# Patient Record
Sex: Female | Born: 1950 | Race: White | Hispanic: No | Marital: Married | Smoking: Never smoker
Health system: Southern US, Community
[De-identification: ages and names within clinical notes are randomized; demographics above are authoritative.]

## PROBLEM LIST (undated history)

## (undated) DIAGNOSIS — J309 Allergic rhinitis, unspecified: Secondary | ICD-10-CM

## (undated) DIAGNOSIS — F419 Anxiety disorder, unspecified: Secondary | ICD-10-CM

## (undated) DIAGNOSIS — I471 Supraventricular tachycardia, unspecified: Secondary | ICD-10-CM

## (undated) DIAGNOSIS — H8110 Benign paroxysmal vertigo, unspecified ear: Secondary | ICD-10-CM

## (undated) DIAGNOSIS — E785 Hyperlipidemia, unspecified: Secondary | ICD-10-CM

## (undated) DIAGNOSIS — F439 Reaction to severe stress, unspecified: Secondary | ICD-10-CM

## (undated) DIAGNOSIS — R079 Chest pain, unspecified: Secondary | ICD-10-CM

## (undated) HISTORY — DX: Chest pain, unspecified: R07.9

## (undated) HISTORY — DX: Allergic rhinitis, unspecified: J30.9

## (undated) HISTORY — DX: Benign paroxysmal vertigo, unspecified ear: H81.10

## (undated) HISTORY — DX: Supraventricular tachycardia, unspecified: I47.10

## (undated) HISTORY — DX: Reaction to severe stress, unspecified: F43.9

## (undated) HISTORY — DX: Supraventricular tachycardia: I47.1

## (undated) HISTORY — DX: Anxiety disorder, unspecified: F41.9

## (undated) HISTORY — DX: Hyperlipidemia, unspecified: E78.5

---
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
## 1990-07-24 HISTORY — PX: SEPTOPLASTY: SUR1290

## 1990-07-24 HISTORY — PX: BUNIONECTOMY: SHX129

## 1998-02-25 ENCOUNTER — Other Ambulatory Visit: Admission: RE | Admit: 1998-02-25 | Discharge: 1998-02-25 | Payer: Self-pay | Admitting: Obstetrics and Gynecology

## 1998-06-08 ENCOUNTER — Other Ambulatory Visit: Admission: RE | Admit: 1998-06-08 | Discharge: 1998-06-08 | Payer: Self-pay | Admitting: Obstetrics and Gynecology

## 1998-07-29 ENCOUNTER — Other Ambulatory Visit: Admission: RE | Admit: 1998-07-29 | Discharge: 1998-07-29 | Payer: Self-pay | Admitting: Obstetrics and Gynecology

## 1998-10-21 ENCOUNTER — Other Ambulatory Visit: Admission: RE | Admit: 1998-10-21 | Discharge: 1998-10-21 | Payer: Self-pay | Admitting: Obstetrics and Gynecology

## 1998-11-30 ENCOUNTER — Other Ambulatory Visit: Admission: RE | Admit: 1998-11-30 | Discharge: 1998-11-30 | Payer: Self-pay | Admitting: Obstetrics and Gynecology

## 1999-03-02 ENCOUNTER — Other Ambulatory Visit: Admission: RE | Admit: 1999-03-02 | Discharge: 1999-03-02 | Payer: Self-pay | Admitting: Obstetrics and Gynecology

## 1999-12-29 ENCOUNTER — Other Ambulatory Visit: Admission: RE | Admit: 1999-12-29 | Discharge: 1999-12-29 | Payer: Self-pay | Admitting: Obstetrics and Gynecology

## 2000-01-02 ENCOUNTER — Encounter (INDEPENDENT_AMBULATORY_CARE_PROVIDER_SITE_OTHER): Payer: Self-pay

## 2000-01-02 ENCOUNTER — Other Ambulatory Visit: Admission: RE | Admit: 2000-01-02 | Discharge: 2000-01-02 | Payer: Self-pay | Admitting: Obstetrics and Gynecology

## 2001-02-07 ENCOUNTER — Other Ambulatory Visit: Admission: RE | Admit: 2001-02-07 | Discharge: 2001-02-07 | Payer: Self-pay | Admitting: Obstetrics and Gynecology

## 2002-02-10 ENCOUNTER — Other Ambulatory Visit: Admission: RE | Admit: 2002-02-10 | Discharge: 2002-02-10 | Payer: Self-pay | Admitting: Obstetrics and Gynecology

## 2003-03-02 ENCOUNTER — Other Ambulatory Visit: Admission: RE | Admit: 2003-03-02 | Discharge: 2003-03-02 | Payer: Self-pay | Admitting: Obstetrics and Gynecology

## 2005-01-06 ENCOUNTER — Other Ambulatory Visit: Admission: RE | Admit: 2005-01-06 | Discharge: 2005-01-06 | Payer: Self-pay | Admitting: Family Medicine

## 2009-02-09 ENCOUNTER — Other Ambulatory Visit: Admission: RE | Admit: 2009-02-09 | Discharge: 2009-02-09 | Payer: Self-pay | Admitting: Family Medicine

## 2009-04-20 ENCOUNTER — Encounter: Admission: RE | Admit: 2009-04-20 | Discharge: 2009-04-20 | Payer: Self-pay | Admitting: Family Medicine

## 2009-04-27 ENCOUNTER — Encounter: Admission: RE | Admit: 2009-04-27 | Discharge: 2009-04-27 | Payer: Self-pay | Admitting: Family Medicine

## 2010-05-30 ENCOUNTER — Encounter: Admission: RE | Admit: 2010-05-30 | Discharge: 2010-05-30 | Payer: Self-pay | Admitting: Family Medicine

## 2010-06-28 ENCOUNTER — Other Ambulatory Visit
Admission: RE | Admit: 2010-06-28 | Discharge: 2010-06-28 | Payer: Self-pay | Source: Home / Self Care | Admitting: Family Medicine

## 2010-08-14 ENCOUNTER — Encounter: Payer: Self-pay | Admitting: Family Medicine

## 2011-04-27 ENCOUNTER — Other Ambulatory Visit: Payer: Self-pay | Admitting: Family Medicine

## 2011-04-27 DIAGNOSIS — Z1231 Encounter for screening mammogram for malignant neoplasm of breast: Secondary | ICD-10-CM

## 2011-06-01 ENCOUNTER — Ambulatory Visit
Admission: RE | Admit: 2011-06-01 | Discharge: 2011-06-01 | Disposition: A | Discharge status: Home / Self Care | Payer: BC Managed Care – PPO | Source: Ambulatory Visit | Attending: Family Medicine | Admitting: Family Medicine

## 2011-06-01 DIAGNOSIS — Z1231 Encounter for screening mammogram for malignant neoplasm of breast: Secondary | ICD-10-CM

## 2011-09-14 ENCOUNTER — Ambulatory Visit (INDEPENDENT_AMBULATORY_CARE_PROVIDER_SITE_OTHER): Payer: BC Managed Care – PPO | Admitting: Sports Medicine

## 2011-09-14 ENCOUNTER — Encounter: Payer: Self-pay | Admitting: Sports Medicine

## 2011-09-14 VITALS — BP 130/85 | HR 77 | Ht 65.0 in | Wt 145.0 lb

## 2011-09-14 DIAGNOSIS — M7660 Achilles tendinitis, unspecified leg: Secondary | ICD-10-CM

## 2011-09-14 DIAGNOSIS — M7661 Achilles tendinitis, right leg: Secondary | ICD-10-CM

## 2011-09-14 DIAGNOSIS — M7662 Achilles tendinitis, left leg: Secondary | ICD-10-CM

## 2011-09-14 MED ORDER — NITROGLYCERIN 0.2 MG/HR TD PT24: MEDICATED_PATCH | TRANSDERMAL | Status: DC

## 2011-09-14 NOTE — Patient Instructions (Addendum)
Use 1/4 patch of NTG to RT Achilles - place over nodule and leave for 24 hours Then replace the next day and keep doing this   use heel lifts to take pressure off Achilles  Exercise program to rehabilitate the Achilles  Do twice daily with up to mild to moderate pain OK - stop or reduce if moderate to severe pain  After 1 week if no headaches try using NTG patches on both Achilles tendons  Recheck with [place] in 4 to 6 weeks  Thank you for seeing [place] today!

## 2011-09-14 NOTE — Progress Notes (Signed)
  Subjective:    Patient ID: Sheryl Edwards, female    DOB: [birth year], 61 y.o.   MRN: [id number]  HPI  Sheryl Edwards is a pleasant 61 yo female patient with chronic history of Achilles tendinopathy bilateral. She was seen about 6 years at [facility] and referred  to physical therapy for iontophoresis treatment which did not help much. She also has been using orthotics which has not helped her at all. She describes her pain he is a dull ache in both Achilles tendons, worse with weightbearing activities, 3/10 in intensity, she can not perform any physical activity due to the heel pain. No numbness or tingling. Denies an injury to her ankles. He has been a chronic and debilitating condition for her.  There is no problem list on file for this patient.  No current outpatient prescriptions on file prior to visit.   Allergies  Allergen Reactions  . Codeine Nausea Only     Review of Systems  Constitutional: Negative for fever, chills, diaphoresis and fatigue.  Musculoskeletal: Negative for back pain, joint swelling, arthralgias and gait problem.  Neurological: Negative for dizziness, tremors, weakness and numbness.       Objective:   Physical Exam  Constitutional: She is oriented to person, place, and time. She appears well-developed and well-nourished.       BP 130/85  Pulse 77  Ht 5\' 5"  (1.651 m)  Wt 145 lb (65.772 kg)  BMI 24.13 kg/m2   Pulmonary/Chest: Effort normal.  Musculoskeletal:        Right ankle with intact skin, no swelling , no inflammation. FROM. Achilles tendon is intact. There is a nodule in the watershed area of the left tendon measuring 2 cm in diameter. Tender to palpation. Thompson test is negative. She is able to do resisted plantarflexion. Neurovascularly intact   Left ankle with intact skin, no swelling , no inflammation. FROM. Achilles tendon is intact. There is a nodule in the watershed area of the left tendon measuring 2.5 cm in diameter. Tender to palpation.  Thompson test is negative. She is able to do resisted plantarflexion. Neurovascularly intact .             Neurological: She is alert and oriented to person, place, and time.  Skin: Skin is warm. No rash noted. No erythema.  Psychiatric: She has a normal mood and affect. Her behavior is normal. Thought content normal.      MSK U/S : Right achilles with a nodule measuring 0.85cm, linear hypoechoic image along the Achilles tendon and intrasubstance in the Achilles tendon. No Achilles tendon rupture. No increase in Doppler flow activity. No calcification in the attachment of the Achilles tendon in the calcaneus.  Left achilles with a nodule measuring 1.05cm, linear hypoechoic image along the Achilles tendon  and intrasubstance in the Achilles tendon less that the right side,. No Achilles tendon rupture. No increase in Doppler flow activity. No calcification in the attachment of the Achilles tendon in the calcaneus.        Assessment & Plan:   1. Achilles tendinitis on left   2. Achilles rupture, right    Nitro patch protocol Heel lift Achilles excentric stretches/strenghtening exercises program F/U in 6 weeks

## 2011-10-04 ENCOUNTER — Other Ambulatory Visit: Payer: Self-pay | Admitting: *Deleted

## 2011-10-04 MED ORDER — NITROGLYCERIN 0.2 MG/HR TD PT24: MEDICATED_PATCH | TRANSDERMAL | Status: DC

## 2011-10-12 ENCOUNTER — Encounter: Payer: Self-pay | Admitting: Sports Medicine

## 2011-10-12 ENCOUNTER — Ambulatory Visit (INDEPENDENT_AMBULATORY_CARE_PROVIDER_SITE_OTHER): Payer: BC Managed Care – PPO | Admitting: Sports Medicine

## 2011-10-12 VITALS — BP 142/90 | HR 65

## 2011-10-12 DIAGNOSIS — M7660 Achilles tendinitis, unspecified leg: Secondary | ICD-10-CM | POA: Insufficient documentation

## 2011-10-12 NOTE — Assessment & Plan Note (Signed)
She has excellent improvement with only 6 weeks of treatment I think the nodule on the right will take 6-12 months to resolve  Keep up nitroglycerin patches Keep up exercise program  Recheck in 6 weeks and repeat ultrasound  Heel lifts looked okay today but return if she needs replacements

## 2011-10-12 NOTE — Progress Notes (Signed)
  Subjective:    Patient ID: Sheryl Edwards, female    DOB: [birth year], 61 y.o.   MRN: [id number]  HPI  Pt presents to clinic for f/u of bilat achilles tendinitis which she feels is improving. Notes AT nodule on rt has gotten smaller.  Compliant with home exercises. Using 1/2 NTG patch on bilat ATs daily. Soreness has improved.  States that her leg "seizes up" behind her knees when she walks too much or if she does too many heel raises.     Review of Systems     Objective:   Physical Exam No acute distress Good dorsi and plantar flexion bilat  Right Achilles tendon has a nodule that is thickened from 2-6 cm above the calcaneus This is now nontender This feels somewhat smoother than on last examination Remainder of tendon feels normal and is slightly thick  Left Achilles tendon does not show any nodules There is mild thickness noted now and not as much as before  MSK ultrasound Nodule on right Achilles tendon still is 0.85 cm in greatest thickness The tendon has much less hypoechoic change The split area in the right tendon has now closed and there is some calcification  Left Achilles tendon shows normal echoic change The tendon is now 0.7 cm in greatest thickness whereas it was 1 point for a last visit Remainder of this tendon looks normal      Assessment & Plan:

## 2011-10-12 NOTE — Patient Instructions (Addendum)
Please continue using 1/2 nitroglycerin on each achilles tendon  Continue heel raises - start doing some on 1 leg at a time  Please follow up in 6 weeks   Thank you for seeing [place] today!

## 2011-11-23 ENCOUNTER — Encounter: Payer: Self-pay | Admitting: Sports Medicine

## 2011-11-23 ENCOUNTER — Ambulatory Visit (INDEPENDENT_AMBULATORY_CARE_PROVIDER_SITE_OTHER): Payer: BC Managed Care – PPO | Admitting: Sports Medicine

## 2011-11-23 VITALS — BP 131/85 | HR 61

## 2011-11-23 DIAGNOSIS — M7660 Achilles tendinitis, unspecified leg: Secondary | ICD-10-CM

## 2011-11-23 NOTE — Patient Instructions (Signed)
Continue 1/2 nitroglycerin patch on each achilles tendon  Continue home exercises  Please follow up in 6 weeks  Thank you for seeing [place] today!

## 2011-11-23 NOTE — Assessment & Plan Note (Signed)
Bilateral tendinopathy present for several years  Now starting to feel better and allowing her to walk more  Cont NTG protocol Modify exercises by what she can tolerate without calf tightness persisting  Reck 6 wks  Expect we will be treating her long term - 12 mons or so to try to remodel these

## 2011-11-23 NOTE — Progress Notes (Signed)
  Subjective:    Patient ID: Sheryl Edwards, female    DOB: [birth year], 61 y.o.   MRN: [id number]  HPI  Pt presents to clinic for f/u of bilat achilles tendinitis with nodules which she reports is 25% improved. She still has burning and pain sometimes.  Has calf cramping with a lot of walking or when doing the home exercises.   Wearing heel lifts as much as possible. Using 1/2 patch on bilat achilles.   Review of Systems:  Still gets mild headaches at times w patch     Objective:   Physical Exam NAD  Rt nodule swollen and tender 4-6 cm above heel Lt AT feels slightly thick, but close to normal shape Lt foot- hallux valgus shift, lat 5th MTP subluxation RT has less shift of great toe  MSK [place] Nodule on RT is still 1.03 cms thick AP No neovessels Trans view shows good tendon structure and no real gaps in tissue  Left AT is less nodular and more fusiform AT is still ~ 0.9 cm thick AP No neovessels         Assessment & Plan:

## 2012-01-04 ENCOUNTER — Encounter: Payer: Self-pay | Admitting: Sports Medicine

## 2012-01-04 ENCOUNTER — Ambulatory Visit (INDEPENDENT_AMBULATORY_CARE_PROVIDER_SITE_OTHER): Payer: BC Managed Care – PPO | Admitting: Sports Medicine

## 2012-01-04 VITALS — BP 127/84 | HR 68

## 2012-01-04 DIAGNOSIS — M7660 Achilles tendinitis, unspecified leg: Secondary | ICD-10-CM

## 2012-01-04 NOTE — Assessment & Plan Note (Signed)
This is severe with chronic thickening and is bilateral Large nodule on the right will probably take some time to heal  I would continue her nitroglycerin and her exercise program Trial of a compression sleeve today  Heel lifts  I think it is reasonable to try A.RT therapy and see if she gets some benefit. She will see Dr. Vear Clock and if this is improving we'll continue with that treatment. We will Dr. Theron Arista scan and recheck her in 2 months.

## 2012-01-04 NOTE — Progress Notes (Signed)
  Subjective:    Patient ID: Sheryl Edwards, female    DOB: [birth year], 61 y.o.   MRN: [id number]  HPI  Pt presents to clinic for f/u of bilat achilles tendinitis which she feels has improved slightly. Continues using 1/2 NTG patch daily. Compliant with home exercises- heel raises. Has seen [facility] for ART 3 times- was painful at 1st treatment, but improved the 2nd and 3rd.  Her strength has improved as she can now do the heel raise exercises on one leg She can do as many as 20 [place] before her calf feels tight  She does not have limitations at work but does have soreness and some pain in the evenings when she returns home or after she's been sitting for a while    Review of Systems     Objective:   Physical Exam No acute distress  Able to do heel raises straight leg, and bent knee with proper form  Right Achilles tendon has a thick nodule about 5-6 cm above the calcaneus  there is no redness or palpable swelling over other parts of the tendon  Left Achilles tendon feels thickened but seem smooth to palpation with no nodules No redness or swelling  Tenderness to palpation is less bilaterally  Muscular skeletal ultrasound The right Achilles tendon continues to show a thickened nodule that is now 1.15 cm in its widest area which is about 5-6 cm above the heel insertion. There is also some hypoechoic change over the nodule that appears to be possible fluid. Transverse scan of the nodule reveals that there is less fiber disruption Doppler activity is decreased but there are still a few abnormal neo-vessels  Left Achilles tendon shows normal appearance of the fibers No evidence of tearing The widest part of the tendon appears to be about 1.0 cm No abnormal Doppler activity       Assessment & Plan:

## 2012-01-04 NOTE — Patient Instructions (Addendum)
Continue using nitroglycerin patches on both heels  Continue heel raises   Use compression sleeve on ankle during the day as much as comfortable, and 2 hours at night if you do not wear during the day  Continue active release therapy   Please follow up in 2 months  Thank you for seeing [place] today!

## 2012-05-08 ENCOUNTER — Other Ambulatory Visit: Payer: Self-pay | Admitting: Dermatology

## 2012-08-30 ENCOUNTER — Other Ambulatory Visit: Payer: Self-pay | Admitting: Dermatology

## 2012-09-02 ENCOUNTER — Other Ambulatory Visit: Payer: Self-pay | Admitting: Obstetrics & Gynecology

## 2014-02-18 ENCOUNTER — Ambulatory Visit (INDEPENDENT_AMBULATORY_CARE_PROVIDER_SITE_OTHER): Payer: BC Managed Care – PPO

## 2014-02-18 VITALS — BP 123/85 | HR 75 | Resp 15 | Ht 65.0 in | Wt 153.0 lb

## 2014-02-18 DIAGNOSIS — M79609 Pain in unspecified limb: Secondary | ICD-10-CM

## 2014-02-18 DIAGNOSIS — M79605 Pain in left leg: Secondary | ICD-10-CM

## 2014-02-18 DIAGNOSIS — M722 Plantar fascial fibromatosis: Secondary | ICD-10-CM

## 2014-02-18 MED ORDER — TRIAMCINOLONE ACETONIDE 10 MG/ML IJ SUSP: 10.0000 mg | Freq: Once | INTRAMUSCULAR | Status: AC

## 2014-02-18 MED ORDER — MELOXICAM 15 MG PO TABS: 15.0000 mg | ORAL_TABLET | Freq: Every day | ORAL | Status: DC

## 2014-02-18 NOTE — Patient Instructions (Signed)

## 2014-02-18 NOTE — Progress Notes (Signed)
   Subjective:    Patient ID: Sheryl Edwards, female    DOB: 11/23/1950, 63 y.o.   MRN: [id number]  HPI Comments: N plantar fasciitis L left heel and inferior arch D 6 weeks ago O after a long walk in poor fitting shoes C pulling pain A weightbearing after sitting, and resting T stretching and Pilates, PT for right foot     Review of Systems  All other systems reviewed and are negative.      Objective:   Physical Exam  Lower extremity objective findings intact pedal pulses noted bilateral DP and PT Refill time 3 seconds all digits epicritic and proprioceptive sensations intact and symmetric is a 63-year-old female well-developed well-nourished oriented x3 presents at this time with recalcitrant recurrent heel pain. Her orthotics are worn need replacing or refurbishing at this time she has stopped using them left heel has re\re exacerbated there is a painful swollen lump on palpation at the medial calcaneal tubercle insertion site x-rays reveal most no spurs mild fascia thickening noted      Assessment & Plan:  Assessment plantar fasciitis/heel spur syndrome. At this time an injection tender with Kenalog 20 mg Xylocaine plain infiltrated to the left heel fascial strapping is applied patient request also patient will drop-off orthotics for refurbishing and recovering in the interim fascial strapping we'll be worn for 5 days also recommended ice to the area and [facility] or meloxicam is furnished for the patient. Evaluated at the next month for followup and orthotic use was orthotics have been recovered  Alvan Dameichard Sikora DPM

## 2014-02-25 ENCOUNTER — Ambulatory Visit: Payer: Self-pay

## 2014-03-18 ENCOUNTER — Ambulatory Visit (INDEPENDENT_AMBULATORY_CARE_PROVIDER_SITE_OTHER): Payer: BC Managed Care – PPO

## 2014-03-18 VITALS — BP 130/82 | HR 70 | Resp 12

## 2014-03-18 DIAGNOSIS — M79605 Pain in left leg: Secondary | ICD-10-CM

## 2014-03-18 DIAGNOSIS — M79609 Pain in unspecified limb: Secondary | ICD-10-CM

## 2014-03-18 DIAGNOSIS — M722 Plantar fascial fibromatosis: Secondary | ICD-10-CM

## 2014-03-18 NOTE — Progress Notes (Signed)
   Subjective:    Patient ID: Sheryl Edwards, female    DOB: July 05, 1951, 63 y.o.   MRN: [id number]  HPI ''LT FOOT HEEL IS DOING MUCH BETTER.''   Review of Systems no new findings or systemic changes noted    Objective:   Physical Exam March the objective findings unchanged patient had significant improvement plantar fascial pain with fascial strapping does have orthotics are worn need recovering however her insurance does not cover her visit to 5000 unremarkable it just started over this time I will recover her old orthotics and patient recalled was orthotics are ready for fitting and dispensing in the interim maintain ice and anti-inflammatory as needed maintain a stable shoe       Assessment & Plan:  Reappointed in one month after orthotics are read dispensed after adjusting Dr. Ralene Cork will just orthotics himself into the top cover on the west maintain mobility in the interim. Maintain ice to the heels every evening to   Alvan Dame DP

## 2014-03-18 NOTE — Patient Instructions (Signed)

## 2014-03-25 DIAGNOSIS — M722 Plantar fascial fibromatosis: Secondary | ICD-10-CM

## 2014-03-26 ENCOUNTER — Telehealth: Payer: Self-pay | Admitting: *Deleted

## 2014-03-26 NOTE — Telephone Encounter (Signed)
I called and advised her to take the anti-inflammatory.  She stated I will, thank you for calling honey.  Have a great Labor Day weekend!  I told her thank you.

## 2014-03-26 NOTE — Telephone Encounter (Signed)
I'm a patient of Dr. Dionne Bucy and I had a recheck last week and all was well.  I'm starting to have some pain in my arch, my Plantar Fasciitis.  I just need some guidance.  I still have a refill left on my prescription.  Sorry, I thought I was well.  Call me on my work number or my cell number which I can't answer at work.  Thank you.

## 2014-04-02 ENCOUNTER — Encounter: Payer: Self-pay | Admitting: *Deleted

## 2014-06-16 ENCOUNTER — Ambulatory Visit: Payer: BC Managed Care – PPO

## 2014-06-30 ENCOUNTER — Ambulatory Visit (INDEPENDENT_AMBULATORY_CARE_PROVIDER_SITE_OTHER): Payer: BC Managed Care – PPO

## 2014-06-30 VITALS — BP 128/80 | HR 82 | Resp 12

## 2014-06-30 DIAGNOSIS — M722 Plantar fascial fibromatosis: Secondary | ICD-10-CM

## 2014-06-30 DIAGNOSIS — M79605 Pain in left leg: Secondary | ICD-10-CM

## 2014-06-30 MED ORDER — TRIAMCINOLONE ACETONIDE 10 MG/ML IJ SUSP: 10.0000 mg | Freq: Once | INTRAMUSCULAR | Status: AC

## 2014-06-30 NOTE — Progress Notes (Signed)
   Subjective:    Patient ID: Sheryl Edwards, female    DOB: 03/26/1951, 63 y.o.   MRN: [id number]  HPI ''LT FOOT HEEL STILL HURTING BUT THE WRAPPING AND INJECTION HELPS.''   Review of Systems no new findings or systemic changes noted     Objective:   Physical Exam Neurovascular status is unchanged pedal pulses palpable the taping had significant improvement well was in place as did the injection was helpful this time based on that improvement patient is given additional injection tendons Kenalog 20 mg Xylocaine plain and will use her orthotics more efficiently. His bili wearing orthotics in her athletic shoes will use them in her other shoes including at work in her clogs and casual shoes.       Assessment & Plan:  Assessment plantar fasciitis/heel spur syndrome response to strapping and steroid injection a booster steroid injection tendons Kenalog is delivered at this time will maintain orthoses recheck in one to 2 months if fails to maintain improvement or if it exacerbates may be candidate for more invasive options [place] on progress or failure to improve next  Alvan Dameichard Jamina Macbeth DPM

## 2014-06-30 NOTE — Patient Instructions (Signed)

## 2014-07-09 ENCOUNTER — Other Ambulatory Visit: Payer: Self-pay | Admitting: Dermatology

## 2014-09-24 ENCOUNTER — Other Ambulatory Visit: Payer: Self-pay | Admitting: Obstetrics & Gynecology

## 2014-09-25 ENCOUNTER — Other Ambulatory Visit: Payer: Self-pay | Admitting: Obstetrics & Gynecology

## 2014-09-25 DIAGNOSIS — N644 Mastodynia: Secondary | ICD-10-CM

## 2014-09-25 LAB — CYTOLOGY - PAP

## 2014-10-01 ENCOUNTER — Ambulatory Visit
Admission: RE | Admit: 2014-10-01 | Discharge: 2014-10-01 | Disposition: A | Discharge status: Home / Self Care | Payer: BLUE CROSS/BLUE SHIELD | Source: Ambulatory Visit | Attending: Obstetrics & Gynecology | Admitting: Obstetrics & Gynecology

## 2014-10-01 DIAGNOSIS — N644 Mastodynia: Secondary | ICD-10-CM

## 2014-11-25 ENCOUNTER — Other Ambulatory Visit: Payer: Self-pay

## 2015-09-13 ENCOUNTER — Other Ambulatory Visit: Payer: Self-pay | Admitting: Obstetrics & Gynecology

## 2015-09-13 DIAGNOSIS — N644 Mastodynia: Secondary | ICD-10-CM

## 2015-09-16 ENCOUNTER — Ambulatory Visit
Admission: RE | Admit: 2015-09-16 | Discharge: 2015-09-16 | Disposition: A | Discharge status: Home / Self Care | Payer: BLUE CROSS/BLUE SHIELD | Source: Ambulatory Visit | Attending: Obstetrics & Gynecology | Admitting: Obstetrics & Gynecology

## 2015-09-16 DIAGNOSIS — N644 Mastodynia: Secondary | ICD-10-CM

## 2016-02-04 DIAGNOSIS — Z0000 Encounter for general adult medical examination without abnormal findings: Secondary | ICD-10-CM | POA: Diagnosis not present

## 2016-02-11 DIAGNOSIS — Q846 Other congenital malformations of nails: Secondary | ICD-10-CM | POA: Diagnosis not present

## 2016-02-11 DIAGNOSIS — Z1212 Encounter for screening for malignant neoplasm of rectum: Secondary | ICD-10-CM | POA: Diagnosis not present

## 2016-02-11 DIAGNOSIS — E784 Other hyperlipidemia: Secondary | ICD-10-CM | POA: Diagnosis not present

## 2016-02-11 DIAGNOSIS — Z1389 Encounter for screening for other disorder: Secondary | ICD-10-CM | POA: Diagnosis not present

## 2016-02-11 DIAGNOSIS — Z0000 Encounter for general adult medical examination without abnormal findings: Secondary | ICD-10-CM | POA: Diagnosis not present

## 2016-02-11 DIAGNOSIS — M79629 Pain in unspecified upper arm: Secondary | ICD-10-CM | POA: Diagnosis not present

## 2016-02-11 DIAGNOSIS — Z6825 Body mass index (BMI) 25.0-25.9, adult: Secondary | ICD-10-CM | POA: Diagnosis not present

## 2016-02-25 DIAGNOSIS — M47814 Spondylosis without myelopathy or radiculopathy, thoracic region: Secondary | ICD-10-CM | POA: Diagnosis not present

## 2016-02-25 DIAGNOSIS — M419 Scoliosis, unspecified: Secondary | ICD-10-CM | POA: Diagnosis not present

## 2016-11-06 DIAGNOSIS — Z1231 Encounter for screening mammogram for malignant neoplasm of breast: Secondary | ICD-10-CM | POA: Diagnosis not present

## 2016-11-06 DIAGNOSIS — Z01419 Encounter for gynecological examination (general) (routine) without abnormal findings: Secondary | ICD-10-CM | POA: Diagnosis not present

## 2016-11-06 DIAGNOSIS — Z6825 Body mass index (BMI) 25.0-25.9, adult: Secondary | ICD-10-CM | POA: Diagnosis not present

## 2016-12-25 DIAGNOSIS — F4323 Adjustment disorder with mixed anxiety and depressed mood: Secondary | ICD-10-CM | POA: Diagnosis not present

## 2017-01-08 DIAGNOSIS — F4323 Adjustment disorder with mixed anxiety and depressed mood: Secondary | ICD-10-CM | POA: Diagnosis not present

## 2017-01-15 DIAGNOSIS — F4323 Adjustment disorder with mixed anxiety and depressed mood: Secondary | ICD-10-CM | POA: Diagnosis not present

## 2017-02-07 DIAGNOSIS — E784 Other hyperlipidemia: Secondary | ICD-10-CM | POA: Diagnosis not present

## 2017-02-12 DIAGNOSIS — F4323 Adjustment disorder with mixed anxiety and depressed mood: Secondary | ICD-10-CM | POA: Diagnosis not present

## 2017-02-14 DIAGNOSIS — Z0000 Encounter for general adult medical examination without abnormal findings: Secondary | ICD-10-CM | POA: Diagnosis not present

## 2017-02-14 DIAGNOSIS — Z1389 Encounter for screening for other disorder: Secondary | ICD-10-CM | POA: Diagnosis not present

## 2017-02-14 DIAGNOSIS — E784 Other hyperlipidemia: Secondary | ICD-10-CM | POA: Diagnosis not present

## 2017-02-14 DIAGNOSIS — Z6825 Body mass index (BMI) 25.0-25.9, adult: Secondary | ICD-10-CM | POA: Diagnosis not present

## 2017-02-26 DIAGNOSIS — F4323 Adjustment disorder with mixed anxiety and depressed mood: Secondary | ICD-10-CM | POA: Diagnosis not present

## 2017-04-09 DIAGNOSIS — F4323 Adjustment disorder with mixed anxiety and depressed mood: Secondary | ICD-10-CM | POA: Diagnosis not present

## 2017-05-21 DIAGNOSIS — F4323 Adjustment disorder with mixed anxiety and depressed mood: Secondary | ICD-10-CM | POA: Diagnosis not present

## 2017-09-27 DIAGNOSIS — D485 Neoplasm of uncertain behavior of skin: Secondary | ICD-10-CM | POA: Diagnosis not present

## 2017-09-27 DIAGNOSIS — D225 Melanocytic nevi of trunk: Secondary | ICD-10-CM | POA: Diagnosis not present

## 2017-11-13 DIAGNOSIS — N390 Urinary tract infection, site not specified: Secondary | ICD-10-CM | POA: Diagnosis not present

## 2017-11-13 DIAGNOSIS — Z6825 Body mass index (BMI) 25.0-25.9, adult: Secondary | ICD-10-CM | POA: Diagnosis not present

## 2017-11-13 DIAGNOSIS — Z1231 Encounter for screening mammogram for malignant neoplasm of breast: Secondary | ICD-10-CM | POA: Diagnosis not present

## 2017-11-13 DIAGNOSIS — N3090 Cystitis, unspecified without hematuria: Secondary | ICD-10-CM | POA: Diagnosis not present

## 2017-11-13 DIAGNOSIS — Z01419 Encounter for gynecological examination (general) (routine) without abnormal findings: Secondary | ICD-10-CM | POA: Diagnosis not present

## 2017-11-20 DIAGNOSIS — K5904 Chronic idiopathic constipation: Secondary | ICD-10-CM | POA: Diagnosis not present

## 2017-11-20 DIAGNOSIS — Z1211 Encounter for screening for malignant neoplasm of colon: Secondary | ICD-10-CM | POA: Diagnosis not present

## 2017-12-03 DIAGNOSIS — Z1211 Encounter for screening for malignant neoplasm of colon: Secondary | ICD-10-CM | POA: Diagnosis not present

## 2018-01-09 DIAGNOSIS — Z419 Encounter for procedure for purposes other than remedying health state, unspecified: Secondary | ICD-10-CM | POA: Diagnosis not present

## 2018-01-15 DIAGNOSIS — M7541 Impingement syndrome of right shoulder: Secondary | ICD-10-CM | POA: Diagnosis not present

## 2018-01-15 DIAGNOSIS — G8929 Other chronic pain: Secondary | ICD-10-CM | POA: Diagnosis not present

## 2018-01-15 DIAGNOSIS — M25511 Pain in right shoulder: Secondary | ICD-10-CM | POA: Diagnosis not present

## 2018-01-15 DIAGNOSIS — M19011 Primary osteoarthritis, right shoulder: Secondary | ICD-10-CM | POA: Diagnosis not present

## 2018-01-31 DIAGNOSIS — Z13820 Encounter for screening for osteoporosis: Secondary | ICD-10-CM | POA: Diagnosis not present

## 2018-02-26 DIAGNOSIS — Z0000 Encounter for general adult medical examination without abnormal findings: Secondary | ICD-10-CM | POA: Diagnosis not present

## 2018-02-26 DIAGNOSIS — E7849 Other hyperlipidemia: Secondary | ICD-10-CM | POA: Diagnosis not present

## 2018-03-06 DIAGNOSIS — E7849 Other hyperlipidemia: Secondary | ICD-10-CM | POA: Diagnosis not present

## 2018-03-06 DIAGNOSIS — Z6826 Body mass index (BMI) 26.0-26.9, adult: Secondary | ICD-10-CM | POA: Diagnosis not present

## 2018-03-06 DIAGNOSIS — Z1389 Encounter for screening for other disorder: Secondary | ICD-10-CM | POA: Diagnosis not present

## 2018-03-06 DIAGNOSIS — F4321 Adjustment disorder with depressed mood: Secondary | ICD-10-CM | POA: Diagnosis not present

## 2018-03-06 DIAGNOSIS — Z0000 Encounter for general adult medical examination without abnormal findings: Secondary | ICD-10-CM | POA: Diagnosis not present

## 2018-04-22 DIAGNOSIS — M7551 Bursitis of right shoulder: Secondary | ICD-10-CM | POA: Diagnosis not present

## 2018-05-30 DIAGNOSIS — M7090 Unspecified soft tissue disorder related to use, overuse and pressure of unspecified site: Secondary | ICD-10-CM | POA: Diagnosis not present

## 2018-05-30 DIAGNOSIS — R0789 Other chest pain: Secondary | ICD-10-CM | POA: Diagnosis not present

## 2018-05-30 DIAGNOSIS — Z6826 Body mass index (BMI) 26.0-26.9, adult: Secondary | ICD-10-CM | POA: Diagnosis not present

## 2018-11-26 DIAGNOSIS — M7551 Bursitis of right shoulder: Secondary | ICD-10-CM | POA: Diagnosis not present

## 2018-11-26 DIAGNOSIS — M25511 Pain in right shoulder: Secondary | ICD-10-CM | POA: Diagnosis not present

## 2018-11-26 DIAGNOSIS — G8929 Other chronic pain: Secondary | ICD-10-CM | POA: Diagnosis not present

## 2019-01-15 DIAGNOSIS — Z6827 Body mass index (BMI) 27.0-27.9, adult: Secondary | ICD-10-CM | POA: Diagnosis not present

## 2019-01-15 DIAGNOSIS — Z01419 Encounter for gynecological examination (general) (routine) without abnormal findings: Secondary | ICD-10-CM | POA: Diagnosis not present

## 2019-01-15 DIAGNOSIS — Z1231 Encounter for screening mammogram for malignant neoplasm of breast: Secondary | ICD-10-CM | POA: Diagnosis not present

## 2019-03-07 DIAGNOSIS — Z0000 Encounter for general adult medical examination without abnormal findings: Secondary | ICD-10-CM | POA: Diagnosis not present

## 2019-03-07 DIAGNOSIS — E7849 Other hyperlipidemia: Secondary | ICD-10-CM | POA: Diagnosis not present

## 2019-03-14 DIAGNOSIS — E7849 Other hyperlipidemia: Secondary | ICD-10-CM | POA: Diagnosis not present

## 2019-03-14 DIAGNOSIS — Z0000 Encounter for general adult medical examination without abnormal findings: Secondary | ICD-10-CM | POA: Diagnosis not present

## 2019-03-14 DIAGNOSIS — J329 Chronic sinusitis, unspecified: Secondary | ICD-10-CM | POA: Diagnosis not present

## 2019-03-14 DIAGNOSIS — M5412 Radiculopathy, cervical region: Secondary | ICD-10-CM | POA: Diagnosis not present

## 2019-03-19 DIAGNOSIS — Z23 Encounter for immunization: Secondary | ICD-10-CM | POA: Diagnosis not present

## 2019-03-19 DIAGNOSIS — M5412 Radiculopathy, cervical region: Secondary | ICD-10-CM | POA: Diagnosis not present

## 2020-01-01 DIAGNOSIS — J029 Acute pharyngitis, unspecified: Secondary | ICD-10-CM | POA: Diagnosis not present

## 2020-01-01 DIAGNOSIS — J020 Streptococcal pharyngitis: Secondary | ICD-10-CM | POA: Diagnosis not present

## 2020-01-01 DIAGNOSIS — R591 Generalized enlarged lymph nodes: Secondary | ICD-10-CM | POA: Diagnosis not present

## 2020-01-03 DIAGNOSIS — Z03818 Encounter for observation for suspected exposure to other biological agents ruled out: Secondary | ICD-10-CM | POA: Diagnosis not present

## 2020-01-03 DIAGNOSIS — Z20822 Contact with and (suspected) exposure to covid-19: Secondary | ICD-10-CM | POA: Diagnosis not present

## 2020-03-03 DIAGNOSIS — M19011 Primary osteoarthritis, right shoulder: Secondary | ICD-10-CM | POA: Diagnosis not present

## 2020-03-03 DIAGNOSIS — M75101 Unspecified rotator cuff tear or rupture of right shoulder, not specified as traumatic: Secondary | ICD-10-CM | POA: Diagnosis not present

## 2020-03-03 DIAGNOSIS — M25511 Pain in right shoulder: Secondary | ICD-10-CM | POA: Diagnosis not present

## 2020-03-30 DIAGNOSIS — E785 Hyperlipidemia, unspecified: Secondary | ICD-10-CM | POA: Diagnosis not present

## 2020-04-06 DIAGNOSIS — H8110 Benign paroxysmal vertigo, unspecified ear: Secondary | ICD-10-CM | POA: Diagnosis not present

## 2020-04-06 DIAGNOSIS — J329 Chronic sinusitis, unspecified: Secondary | ICD-10-CM | POA: Diagnosis not present

## 2020-04-06 DIAGNOSIS — N393 Stress incontinence (female) (male): Secondary | ICD-10-CM | POA: Diagnosis not present

## 2020-04-06 DIAGNOSIS — Z0000 Encounter for general adult medical examination without abnormal findings: Secondary | ICD-10-CM | POA: Diagnosis not present

## 2020-04-06 DIAGNOSIS — Z1212 Encounter for screening for malignant neoplasm of rectum: Secondary | ICD-10-CM | POA: Diagnosis not present

## 2020-04-06 DIAGNOSIS — F4321 Adjustment disorder with depressed mood: Secondary | ICD-10-CM | POA: Diagnosis not present

## 2020-04-06 DIAGNOSIS — E785 Hyperlipidemia, unspecified: Secondary | ICD-10-CM | POA: Diagnosis not present

## 2020-04-06 DIAGNOSIS — R350 Frequency of micturition: Secondary | ICD-10-CM | POA: Diagnosis not present

## 2020-04-14 DIAGNOSIS — M75101 Unspecified rotator cuff tear or rupture of right shoulder, not specified as traumatic: Secondary | ICD-10-CM | POA: Diagnosis not present

## 2020-05-14 DIAGNOSIS — H5203 Hypermetropia, bilateral: Secondary | ICD-10-CM | POA: Diagnosis not present

## 2020-05-14 DIAGNOSIS — H524 Presbyopia: Secondary | ICD-10-CM | POA: Diagnosis not present

## 2020-05-14 DIAGNOSIS — H25013 Cortical age-related cataract, bilateral: Secondary | ICD-10-CM | POA: Diagnosis not present

## 2020-05-14 DIAGNOSIS — H2513 Age-related nuclear cataract, bilateral: Secondary | ICD-10-CM | POA: Diagnosis not present

## 2020-06-10 DIAGNOSIS — Z6826 Body mass index (BMI) 26.0-26.9, adult: Secondary | ICD-10-CM | POA: Diagnosis not present

## 2020-06-10 DIAGNOSIS — Z124 Encounter for screening for malignant neoplasm of cervix: Secondary | ICD-10-CM | POA: Diagnosis not present

## 2020-06-10 DIAGNOSIS — Z01419 Encounter for gynecological examination (general) (routine) without abnormal findings: Secondary | ICD-10-CM | POA: Diagnosis not present

## 2020-06-10 DIAGNOSIS — Z1231 Encounter for screening mammogram for malignant neoplasm of breast: Secondary | ICD-10-CM | POA: Diagnosis not present

## 2020-09-13 DIAGNOSIS — M2042 Other hammer toe(s) (acquired), left foot: Secondary | ICD-10-CM | POA: Diagnosis not present

## 2020-09-13 DIAGNOSIS — M7661 Achilles tendinitis, right leg: Secondary | ICD-10-CM | POA: Diagnosis not present

## 2020-09-13 DIAGNOSIS — M21612 Bunion of left foot: Secondary | ICD-10-CM | POA: Diagnosis not present

## 2020-09-16 DIAGNOSIS — M7661 Achilles tendinitis, right leg: Secondary | ICD-10-CM | POA: Diagnosis not present

## 2020-09-24 DIAGNOSIS — M7661 Achilles tendinitis, right leg: Secondary | ICD-10-CM | POA: Diagnosis not present

## 2020-09-28 DIAGNOSIS — M7661 Achilles tendinitis, right leg: Secondary | ICD-10-CM | POA: Diagnosis not present

## 2020-09-30 DIAGNOSIS — M7661 Achilles tendinitis, right leg: Secondary | ICD-10-CM | POA: Diagnosis not present

## 2020-11-10 DIAGNOSIS — M7541 Impingement syndrome of right shoulder: Secondary | ICD-10-CM | POA: Diagnosis not present

## 2020-11-10 DIAGNOSIS — M19011 Primary osteoarthritis, right shoulder: Secondary | ICD-10-CM | POA: Diagnosis not present

## 2021-03-08 DIAGNOSIS — I499 Cardiac arrhythmia, unspecified: Secondary | ICD-10-CM | POA: Diagnosis not present

## 2021-03-14 ENCOUNTER — Encounter: Payer: Self-pay | Admitting: *Deleted

## 2021-03-14 ENCOUNTER — Ambulatory Visit (INDEPENDENT_AMBULATORY_CARE_PROVIDER_SITE_OTHER): Payer: Self-pay

## 2021-03-14 ENCOUNTER — Other Ambulatory Visit: Payer: Self-pay | Admitting: *Deleted

## 2021-03-14 DIAGNOSIS — R002 Palpitations: Secondary | ICD-10-CM

## 2021-03-14 DIAGNOSIS — I499 Cardiac arrhythmia, unspecified: Secondary | ICD-10-CM

## 2021-03-14 NOTE — Progress Notes (Unsigned)
Patient enrolled for [employer] to mail a 14 da ZIO XT long term holter monitor to her address on file.

## 2021-03-16 DIAGNOSIS — R002 Palpitations: Secondary | ICD-10-CM

## 2021-03-16 DIAGNOSIS — I499 Cardiac arrhythmia, unspecified: Secondary | ICD-10-CM

## 2021-03-23 DIAGNOSIS — I499 Cardiac arrhythmia, unspecified: Secondary | ICD-10-CM | POA: Diagnosis not present

## 2021-03-23 DIAGNOSIS — F418 Other specified anxiety disorders: Secondary | ICD-10-CM | POA: Diagnosis not present

## 2021-03-31 DIAGNOSIS — I499 Cardiac arrhythmia, unspecified: Secondary | ICD-10-CM | POA: Diagnosis not present

## 2021-03-31 DIAGNOSIS — R002 Palpitations: Secondary | ICD-10-CM | POA: Diagnosis not present

## 2021-04-08 DIAGNOSIS — Z20822 Contact with and (suspected) exposure to covid-19: Secondary | ICD-10-CM | POA: Diagnosis not present

## 2021-04-22 DIAGNOSIS — E785 Hyperlipidemia, unspecified: Secondary | ICD-10-CM | POA: Diagnosis not present

## 2021-04-29 DIAGNOSIS — Z1331 Encounter for screening for depression: Secondary | ICD-10-CM | POA: Diagnosis not present

## 2021-04-29 DIAGNOSIS — F418 Other specified anxiety disorders: Secondary | ICD-10-CM | POA: Diagnosis not present

## 2021-04-29 DIAGNOSIS — Z23 Encounter for immunization: Secondary | ICD-10-CM | POA: Diagnosis not present

## 2021-04-29 DIAGNOSIS — Z1339 Encounter for screening examination for other mental health and behavioral disorders: Secondary | ICD-10-CM | POA: Diagnosis not present

## 2021-04-29 DIAGNOSIS — R002 Palpitations: Secondary | ICD-10-CM | POA: Diagnosis not present

## 2021-04-29 DIAGNOSIS — I471 Supraventricular tachycardia: Secondary | ICD-10-CM | POA: Diagnosis not present

## 2021-04-29 DIAGNOSIS — E785 Hyperlipidemia, unspecified: Secondary | ICD-10-CM | POA: Diagnosis not present

## 2021-04-29 DIAGNOSIS — Z0000 Encounter for general adult medical examination without abnormal findings: Secondary | ICD-10-CM | POA: Diagnosis not present

## 2021-05-04 ENCOUNTER — Other Ambulatory Visit: Payer: Self-pay | Admitting: Orthopedic Surgery

## 2021-05-04 DIAGNOSIS — M25511 Pain in right shoulder: Secondary | ICD-10-CM

## 2021-05-04 DIAGNOSIS — G8929 Other chronic pain: Secondary | ICD-10-CM

## 2021-05-27 ENCOUNTER — Other Ambulatory Visit: Payer: Self-pay

## 2021-05-27 ENCOUNTER — Ambulatory Visit
Admission: RE | Admit: 2021-05-27 | Discharge: 2021-05-27 | Disposition: A | Discharge status: Home / Self Care | Payer: PPO | Source: Ambulatory Visit | Attending: Orthopedic Surgery | Admitting: Orthopedic Surgery

## 2021-05-27 DIAGNOSIS — M25511 Pain in right shoulder: Secondary | ICD-10-CM

## 2021-05-27 DIAGNOSIS — G8929 Other chronic pain: Secondary | ICD-10-CM

## 2021-06-15 DIAGNOSIS — M75111 Incomplete rotator cuff tear or rupture of right shoulder, not specified as traumatic: Secondary | ICD-10-CM | POA: Diagnosis not present

## 2021-06-15 DIAGNOSIS — M19011 Primary osteoarthritis, right shoulder: Secondary | ICD-10-CM | POA: Diagnosis not present

## 2021-06-19 ENCOUNTER — Encounter: Payer: Self-pay | Admitting: Cardiovascular Disease

## 2021-06-19 NOTE — Progress Notes (Signed)
Cardiology Office Note:    Date:  06/20/2021   ID:  Sheryl Edwards, DOB 06/01/1951, MRN [id number]  PCP:  Maurice Small, MD   [facility] Providers Cardiologist:  Rasheka Denard  Click to update primary MD,subspecialty MD or APP then REFRESH:1}    Referring MD: Alysia Penna, MD   Chief Complaint  Patient presents with   Palpitations     History of Present Illness:    Sheryl Edwards is a 70 y.o. female with a hx of palpitations.  Her mother was Sheryl Edwards.  We were asked to see her by Dr. Link Snuffer for further evaluation of her paliptations.  Has very rare episodes of palpitations. No significant episodes   No syncope   She had an event monitor in September, 2022.  She had occasional premature atrial contractions and had some episodes of nonsustained supraventricular tachycardia.  She had rare premature ventricular contractions.  No serious arrhythmias were observed. The longest episode of SVT lasted 16 seconds at a rate of 120.  TSH was normal a year ago .  No regular exercise  Is very sedentary.   Wants to get back into exercising  No cp, no dyspnea  Does yard work ,  no dyspnea, nor cp.  Gets hot .  No syncope or presyncope.    Past Medical History:  Diagnosis Date   Allergic rhinitis    Anxiety    BPPV (benign paroxysmal positional vertigo)    Chest pain    Hyperlipidemia    Situational stress    SVT (supraventricular tachycardia) (HCC)     Past Surgical History:  Procedure Laterality Date   BUNIONECTOMY  1992   SEPTOPLASTY  1992   TONSILLECTOMY  1964    Current Medications: Current Meds  Medication Sig   carvedilol (COREG) 3.125 MG tablet Take 1 tablet (3.125 mg total) by mouth 2 (two) times daily with a meal.   Cholecalciferol (VITAMIN D3) 50 MCG (2000 UT) TABS Take by mouth.   co-enzyme Q-10 30 MG capsule Take 30 mg by mouth 3 (three) times daily.   magnesium oxide (MAG-OX) 400 MG tablet Take 400 mg by mouth daily.   Multiple  Vitamins-Minerals (MULTI COMPLETE PO) Take by mouth.   Turmeric 500 MG TABS Take by mouth.   zinc gluconate 50 MG tablet Take 50 mg by mouth daily.   Current Facility-Administered Medications for the 06/20/21 encounter (Office Visit) with Shirl Ludington, Deloris Ping, MD  Medication   triamcinolone acetonide (KENALOG) 10 MG/ML injection 10 mg   triamcinolone acetonide (KENALOG) 10 MG/ML injection 10 mg     Allergies:   Codeine   Social History   Socioeconomic History   Marital status: Married    Spouse name: Not on file   Number of children: Not on file   Years of education: Not on file   Highest education level: Not on file  Occupational History   Not on file  Tobacco Use   Smoking status: Never   Smokeless tobacco: Never  Substance and Sexual Activity   Alcohol use: Yes   Drug use: Not on file   Sexual activity: Not on file  Other Topics Concern   Not on file  Social History Narrative   Not on file   Social Determinants of Health   Financial Resource Strain: Not on file  Food Insecurity: Not on file  Transportation Needs: Not on file  Physical Activity: Not on file  Stress: Not on file  Social Connections: Not on file  Family History: The patient's family history includes CAD in her father; Diabetes in her mother; Hypertension in her mother; Kidney disease in her father; Stroke in her mother.  ROS:   Please see the history of present illness.     All other systems reviewed and are negative.  EKGs/Labs/Other Studies Reviewed:    The following studies were reviewed today:   EKG:    Nov. 28, 2022:  NSR,  occsional PACS   Recent Labs: No results found for requested labs within last 8760 hours.  Recent Lipid Panel No results found for: CHOL, TRIG, HDL, CHOLHDL, VLDL, LDLCALC, LDLDIRECT   Risk Assessment/Calculations:           Physical Exam:    VS:  BP 136/80 (BP Location: Left Arm, Patient Position: Sitting, Cuff Size: Normal)   Pulse 86   Ht 5\' 5"   (1.651 m)   Wt 165 lb 9.6 oz (75.1 kg)   SpO2 97%   BMI 27.56 kg/m     Wt Readings from Last 3 Encounters:  06/20/21 165 lb 9.6 oz (75.1 kg)  02/18/14 153 lb (69.4 kg)  09/14/11 145 lb (65.8 kg)    GEN:  Well nourished, well developed in no acute distress HEENT: Normal NECK: No JVD; No carotid bruits LYMPHATICS: No lymphadenopathy CARDIAC: RRR, no murmurs, rubs, gallops RESPIRATORY:  Clear to auscultation without rales, wheezing or rhonchi  ABDOMEN: Soft, non-tender, non-distended MUSCULOSKELETAL:  No edema; No deformity  SKIN: Warm and dry NEUROLOGIC:  Alert and oriented x 3 PSYCHIATRIC:  Normal affect   ASSESSMENT:    1. Palpitations   2. PAC (premature atrial contraction)   3. SVT (supraventricular tachycardia) (HCC)    PLAN:    In order of problems listed above:  Episodes of premature atrial contractions and SVT. 09/16/11  presents for further evaluation of some palpitations.  Her heart rate irregularities were actually heard by home health nurse from the insurance company.  She cannot feel most of her palpitations.  I reassured her that these are benign.  She has lots of premature atrial contractions and has very brief runs of SVT.  Her thyroid was normal at her primary medical doctor's office recently.  We will try to suppress her PACs and arrhythmias with low-dose carvedilol 3.125 mg twice a day.  We could also consider Toprol-XL 12.5 mg a day.  She still eats quite a bit of processed foods.  I have asked her to cut out her processed foods and her sugary soft drinks.  I have advised her to try to get better sleep.  I have advised her to get regular exercise.  I will see her in 4 to 6 weeks for follow-up visit.           Medication Adjustments/Labs and Tests Ordered: Current medicines are reviewed at length with the patient today.  Concerns regarding medicines are outlined above.  Orders Placed This Encounter  Procedures   EKG 12-Lead   Meds ordered this  encounter  Medications   carvedilol (COREG) 3.125 MG tablet    Sig: Take 1 tablet (3.125 mg total) by mouth 2 (two) times daily with a meal.    Dispense:  60 tablet    Refill:  6    Patient Instructions  Medication Instructions:  Your physician has recommended you make the following change in your medication:  Start Carvedilol 3.125 mg by mouth twice daily  *If you need a refill on your cardiac medications before your next appointment, please  call your pharmacy*   Lab Work: none If you have labs (blood work) drawn today and your tests are completely normal, you will receive your results only by: MyChart Message (if you have MyChart) OR A paper copy in the mail If you have any lab test that is abnormal or we need to change your treatment, we will call you to review the results.   Testing/Procedures: none   Follow-Up: At [facility], you and your health needs are our priority.  As part of our continuing mission to provide you with exceptional heart care, we have created designated Provider Care Teams.  These Care Teams include your primary Cardiologist (physician) and Advanced Practice Providers (APPs -  Physician Assistants and Nurse Practitioners) who all work together to provide you with the care you need, when you need it.  We recommend signing up for the patient portal called "MyChart".  Sign up information is provided on this After Visit Summary.  MyChart is used to connect with patients for Virtual Visits (Telemedicine).  Patients are able to view lab/test results, encounter notes, upcoming appointments, etc.  Non-urgent messages can be sent to your provider as well.   To learn more about what you can do with MyChart, go to [email or web address].    Your next appointment:   August 04, 2021 at 9:20  The format for your next appointment:   In Person  Provider:   Kristeen Miss, MD     Other Instructions     Signed, Kristeen Miss, MD  06/20/2021 4:59 PM     [facility]

## 2021-06-20 ENCOUNTER — Encounter: Payer: Self-pay | Admitting: Cardiovascular Disease

## 2021-06-20 ENCOUNTER — Other Ambulatory Visit: Payer: Self-pay

## 2021-06-20 ENCOUNTER — Ambulatory Visit: Payer: PPO | Admitting: Cardiovascular Disease

## 2021-06-20 VITALS — BP 136/80 | HR 86 | Ht 65.0 in | Wt 165.6 lb

## 2021-06-20 DIAGNOSIS — I491 Atrial premature depolarization: Secondary | ICD-10-CM

## 2021-06-20 DIAGNOSIS — I471 Supraventricular tachycardia, unspecified: Secondary | ICD-10-CM

## 2021-06-20 DIAGNOSIS — R002 Palpitations: Secondary | ICD-10-CM | POA: Diagnosis not present

## 2021-06-20 MED ORDER — CARVEDILOL 3.125 MG PO TABS: 3.1250 mg | ORAL_TABLET | Freq: Two times a day (BID) | ORAL | 6 refills | Status: AC

## 2021-06-20 NOTE — Patient Instructions (Signed)
Medication Instructions:  Your physician has recommended you make the following change in your medication:  Start Carvedilol 3.125 mg by mouth twice daily  *If you need a refill on your cardiac medications before your next appointment, please call your pharmacy*   Lab Work: none If you have labs (blood work) drawn today and your tests are completely normal, you will receive your results only by: MyChart Message (if you have MyChart) OR A paper copy in the mail If you have any lab test that is abnormal or we need to change your treatment, we will call you to review the results.   Testing/Procedures: none   Follow-Up: At [facility], you and your health needs are our priority.  As part of our continuing mission to provide you with exceptional heart care, we have created designated Provider Care Teams.  These Care Teams include your primary Cardiologist (physician) and Advanced Practice Providers (APPs -  Physician Assistants and Nurse Practitioners) who all work together to provide you with the care you need, when you need it.  We recommend signing up for the patient portal called "MyChart".  Sign up information is provided on this After Visit Summary.  MyChart is used to connect with patients for Virtual Visits (Telemedicine).  Patients are able to view lab/test results, encounter notes, upcoming appointments, etc.  Non-urgent messages can be sent to your provider as well.   To learn more about what you can do with MyChart, go to [email or web address].    Your next appointment:   August 04, 2021 at 9:20  The format for your next appointment:   In Person  Provider:   Kristeen Miss, MD     Other Instructions

## 2021-08-03 ENCOUNTER — Encounter: Payer: Self-pay | Admitting: Cardiovascular Disease

## 2021-08-03 NOTE — Progress Notes (Signed)
Cardiology Office Note:    Date:  08/04/2021   ID:  Sheryl Edwards, DOB 1950-08-25, MRN [id number]  PCP:  Alysia Penna, MD   [facility] Providers Cardiologist:  Eugenia Pancoast to update primary MD,subspecialty MD or APP then REFRESH:1}    Referring MD: Maurice Small, MD   Chief Complaint  Patient presents with   Palpitations     Prior notes.:    Sheryl Edwards is a 71 y.o. female with a hx of palpitations.  Her mother was Sheryl Edwards.  We were asked to see her by Dr. Link Snuffer for further evaluation of her paliptations.  Has very rare episodes of palpitations. No significant episodes   No syncope   She had an event monitor in September, 2022.  She had occasional premature atrial contractions and had some episodes of nonsustained supraventricular tachycardia.  She had rare premature ventricular contractions.  No serious arrhythmias were observed. The longest episode of SVT lasted 16 seconds at a rate of 120.  TSH was normal a year ago .  No regular exercise  Is very sedentary.   Wants to get back into exercising  No cp, no dyspnea  Does yard work ,  no dyspnea, nor cp.  Gets hot .  No syncope or presyncope.  Jan. 12, 2023: Sheryl Edwards is seen today for follow up visit for her palpitations. Works at [employer]  Mother was Sheryl Edwards  Her event monitor showed occasional PACs and episodes of nonsustained SVT. Rare PVCs  No serious arrhythmias seen  We started her on coreg 3. 125 BID at her last visit  Advised her to cut out her processed meats, try to get better sleep  Seems to be sleeping better .  Getting some exercise    Past Medical History:  Diagnosis Date   Allergic rhinitis    Anxiety    BPPV (benign paroxysmal positional vertigo)    Chest pain    Hyperlipidemia    Situational stress    SVT (supraventricular tachycardia) (HCC)     Past Surgical History:  Procedure Laterality Date   BUNIONECTOMY  1992   SEPTOPLASTY  1992    TONSILLECTOMY  1964    Current Medications: Current Meds  Medication Sig   carvedilol (COREG) 3.125 MG tablet Take 1 tablet (3.125 mg total) by mouth 2 (two) times daily with a meal.   Cholecalciferol (VITAMIN D3) 50 MCG (2000 UT) TABS Take by mouth.   co-enzyme Q-10 30 MG capsule Take 30 mg by mouth 3 (three) times daily.   magnesium oxide (MAG-OX) 400 MG tablet Take 400 mg by mouth daily.   Multiple Vitamins-Minerals (MULTI COMPLETE PO) Take by mouth.   Turmeric 500 MG TABS Take by mouth.   zinc gluconate 50 MG tablet Take 50 mg by mouth daily.   Current Facility-Administered Medications for the 08/04/21 encounter (Office Visit) with Zanna Hawn, Deloris Ping, MD  Medication   triamcinolone acetonide (KENALOG) 10 MG/ML injection 10 mg   triamcinolone acetonide (KENALOG) 10 MG/ML injection 10 mg     Allergies:   Codeine   Social History   Socioeconomic History   Marital status: Married    Spouse name: Not on file   Number of children: Not on file   Years of education: Not on file   Highest education level: Not on file  Occupational History   Not on file  Tobacco Use   Smoking status: Never   Smokeless tobacco: Never  Substance and Sexual Activity   Alcohol  use: Yes   Drug use: Not on file   Sexual activity: Not on file  Other Topics Concern   Not on file  Social History Narrative   Not on file   Social Determinants of Health   Financial Resource Strain: Not on file  Food Insecurity: Not on file  Transportation Needs: Not on file  Physical Activity: Not on file  Stress: Not on file  Social Connections: Not on file     Family History: The patient's family history includes CAD in her father; Diabetes in her mother; Hypertension in her mother; Kidney disease in her father; Stroke in her mother.  ROS:   Please see the history of present illness.     All other systems reviewed and are negative.  EKGs/Labs/Other Studies Reviewed:    The following studies were reviewed  today:   EKG:         Recent Labs: No results found for requested labs within last 8760 hours.  Recent Lipid Panel No results found for: CHOL, TRIG, HDL, CHOLHDL, VLDL, LDLCALC, LDLDIRECT   Risk Assessment/Calculations:           Physical Exam:    Physical Exam: Blood pressure 128/78, pulse 68, height 5\' 5"  (1.651 m), weight 165 lb 3.2 oz (74.9 kg), SpO2 98 %.  GEN:  Well nourished, well developed in no acute distress HEENT: Normal NECK: No JVD; No carotid bruits LYMPHATICS: No lymphadenopathy CARDIAC: RRR , no murmurs, rubs, gallops RESPIRATORY:  Clear to auscultation without rales, wheezing or rhonchi  ABDOMEN: Soft, non-tender, non-distended MUSCULOSKELETAL:  No edema; No deformity  SKIN: Warm and dry NEUROLOGIC:  Alert and oriented x 3   ASSESSMENT:    No diagnosis found.  PLAN:     Episodes of premature atrial contractions and SVT.   She seems to be doing well .    Cannot feel her palpitations  She should continue her carvedilol.  She asked about decongestants.  I have given her the okay to take antihistamines.  Told her to be careful with using Sudafed but that she could use it for limited time without ill effects as long she feels okay.   She would like to follow-up with on an as-needed basis.          Medication Adjustments/Labs and Tests Ordered: Current medicines are reviewed at length with the patient today.  Concerns regarding medicines are outlined above.  No orders of the defined types were placed in this encounter.  No orders of the defined types were placed in this encounter.   There are no Patient Instructions on file for this visit.   Signed, [place], MD  08/04/2021 9:48 AM    [facility]

## 2021-08-04 ENCOUNTER — Other Ambulatory Visit: Payer: Self-pay

## 2021-08-04 ENCOUNTER — Encounter: Payer: Self-pay | Admitting: Cardiovascular Disease

## 2021-08-04 ENCOUNTER — Ambulatory Visit: Payer: PPO | Admitting: Cardiovascular Disease

## 2021-08-04 VITALS — BP 128/78 | HR 68 | Ht 65.0 in | Wt 165.2 lb

## 2021-08-04 DIAGNOSIS — R002 Palpitations: Secondary | ICD-10-CM

## 2021-08-04 NOTE — Patient Instructions (Signed)
Medication Instructions:  Your physician recommends that you continue on your current medications as directed. Please refer to the Current Medication list given to you today.  *If you need a refill on your cardiac medications before your next appointment, please call your pharmacy*  Lab Work: If you have labs (blood work) drawn today and your tests are completely normal, you will receive your results only by: [place] (if you have MyChart) OR A paper copy in the mail If you have any lab test that is abnormal or we need to change your treatment, we will call you to review the results.  Follow-Up: At [facility], you and your health needs are our priority.  As part of our continuing mission to provide you with exceptional heart care, we have created designated Provider Care Teams.  These Care Teams include your primary Cardiologist (physician) and Advanced Practice Providers (APPs -  Physician Assistants and Nurse Practitioners) who all work together to provide you with the care you need, when you need it.  We recommend signing up for the patient portal called "MyChart".  Sign up information is provided on this After Visit Summary.  MyChart is used to connect with patients for Virtual Visits (Telemedicine).  Patients are able to view lab/test results, encounter notes, upcoming appointments, etc.  Non-urgent messages can be sent to your provider as well.   To learn more about what you can do with MyChart, go to [email or web address].    Your next appointment:   As needed  The format for your next appointment:   In Person  Provider:   Mertie Moores, MD {

## 2021-08-09 DIAGNOSIS — Z01419 Encounter for gynecological examination (general) (routine) without abnormal findings: Secondary | ICD-10-CM | POA: Diagnosis not present

## 2021-08-09 DIAGNOSIS — Z1231 Encounter for screening mammogram for malignant neoplasm of breast: Secondary | ICD-10-CM | POA: Diagnosis not present

## 2021-08-09 DIAGNOSIS — Z6827 Body mass index (BMI) 27.0-27.9, adult: Secondary | ICD-10-CM | POA: Diagnosis not present

## 2021-08-10 ENCOUNTER — Other Ambulatory Visit: Payer: Self-pay | Admitting: Obstetrics & Gynecology

## 2021-08-10 DIAGNOSIS — R928 Other abnormal and inconclusive findings on diagnostic imaging of breast: Secondary | ICD-10-CM

## 2021-08-25 ENCOUNTER — Ambulatory Visit
Admission: RE | Admit: 2021-08-25 | Discharge: 2021-08-25 | Disposition: A | Discharge status: Home / Self Care | Payer: PPO | Source: Ambulatory Visit | Attending: Obstetrics & Gynecology | Admitting: Obstetrics & Gynecology

## 2021-08-25 DIAGNOSIS — R928 Other abnormal and inconclusive findings on diagnostic imaging of breast: Secondary | ICD-10-CM

## 2021-08-25 DIAGNOSIS — R922 Inconclusive mammogram: Secondary | ICD-10-CM | POA: Diagnosis not present

## 2021-11-16 DIAGNOSIS — R5383 Other fatigue: Secondary | ICD-10-CM | POA: Diagnosis not present

## 2021-11-16 DIAGNOSIS — Z1152 Encounter for screening for COVID-19: Secondary | ICD-10-CM | POA: Diagnosis not present

## 2021-11-16 DIAGNOSIS — J019 Acute sinusitis, unspecified: Secondary | ICD-10-CM | POA: Diagnosis not present

## 2021-11-16 DIAGNOSIS — J029 Acute pharyngitis, unspecified: Secondary | ICD-10-CM | POA: Diagnosis not present

## 2021-11-16 DIAGNOSIS — R0981 Nasal congestion: Secondary | ICD-10-CM | POA: Diagnosis not present

## 2021-11-16 DIAGNOSIS — R059 Cough, unspecified: Secondary | ICD-10-CM | POA: Diagnosis not present

## 2022-04-28 DIAGNOSIS — R7989 Other specified abnormal findings of blood chemistry: Secondary | ICD-10-CM | POA: Diagnosis not present

## 2022-04-28 DIAGNOSIS — E785 Hyperlipidemia, unspecified: Secondary | ICD-10-CM | POA: Diagnosis not present

## 2022-05-05 DIAGNOSIS — Z0000 Encounter for general adult medical examination without abnormal findings: Secondary | ICD-10-CM | POA: Diagnosis not present

## 2022-05-05 DIAGNOSIS — F418 Other specified anxiety disorders: Secondary | ICD-10-CM | POA: Diagnosis not present

## 2022-05-05 DIAGNOSIS — R002 Palpitations: Secondary | ICD-10-CM | POA: Diagnosis not present

## 2022-05-05 DIAGNOSIS — I471 Supraventricular tachycardia, unspecified: Secondary | ICD-10-CM | POA: Diagnosis not present

## 2022-05-05 DIAGNOSIS — E785 Hyperlipidemia, unspecified: Secondary | ICD-10-CM | POA: Diagnosis not present

## 2022-06-21 DIAGNOSIS — M75101 Unspecified rotator cuff tear or rupture of right shoulder, not specified as traumatic: Secondary | ICD-10-CM | POA: Diagnosis not present

## 2022-06-21 DIAGNOSIS — D2271 Melanocytic nevi of right lower limb, including hip: Secondary | ICD-10-CM | POA: Diagnosis not present

## 2022-06-21 DIAGNOSIS — L570 Actinic keratosis: Secondary | ICD-10-CM | POA: Diagnosis not present

## 2022-06-21 DIAGNOSIS — D2261 Melanocytic nevi of right upper limb, including shoulder: Secondary | ICD-10-CM | POA: Diagnosis not present

## 2022-06-21 DIAGNOSIS — D2272 Melanocytic nevi of left lower limb, including hip: Secondary | ICD-10-CM | POA: Diagnosis not present

## 2022-06-21 DIAGNOSIS — L821 Other seborrheic keratosis: Secondary | ICD-10-CM | POA: Diagnosis not present

## 2022-06-21 DIAGNOSIS — L820 Inflamed seborrheic keratosis: Secondary | ICD-10-CM | POA: Diagnosis not present

## 2022-06-21 DIAGNOSIS — D225 Melanocytic nevi of trunk: Secondary | ICD-10-CM | POA: Diagnosis not present

## 2022-06-21 DIAGNOSIS — D1801 Hemangioma of skin and subcutaneous tissue: Secondary | ICD-10-CM | POA: Diagnosis not present

## 2022-06-21 DIAGNOSIS — L718 Other rosacea: Secondary | ICD-10-CM | POA: Diagnosis not present

## 2022-06-21 DIAGNOSIS — D2262 Melanocytic nevi of left upper limb, including shoulder: Secondary | ICD-10-CM | POA: Diagnosis not present

## 2022-07-03 IMAGING — MG MM DIGITAL DIAGNOSTIC UNILAT*R* W/ TOMO W/ CAD
4 series · 4 of 12 positions shown · non-contrast
Comparison: Previous exam(s).

CLINICAL DATA: Screening recall for a possible right breast mass.

EXAM:
DIGITAL DIAGNOSTIC UNILATERAL RIGHT MAMMOGRAM WITH TOMOSYNTHESIS AND
CAD; ULTRASOUND RIGHT BREAST LIMITED
TECHNIQUE: Right digital diagnostic mammography and breast tomosynthesis was
performed. The images were evaluated with computer-aided detection.;
Targeted ultrasound examination of the right breast was performed

[R MLO synth-2D]
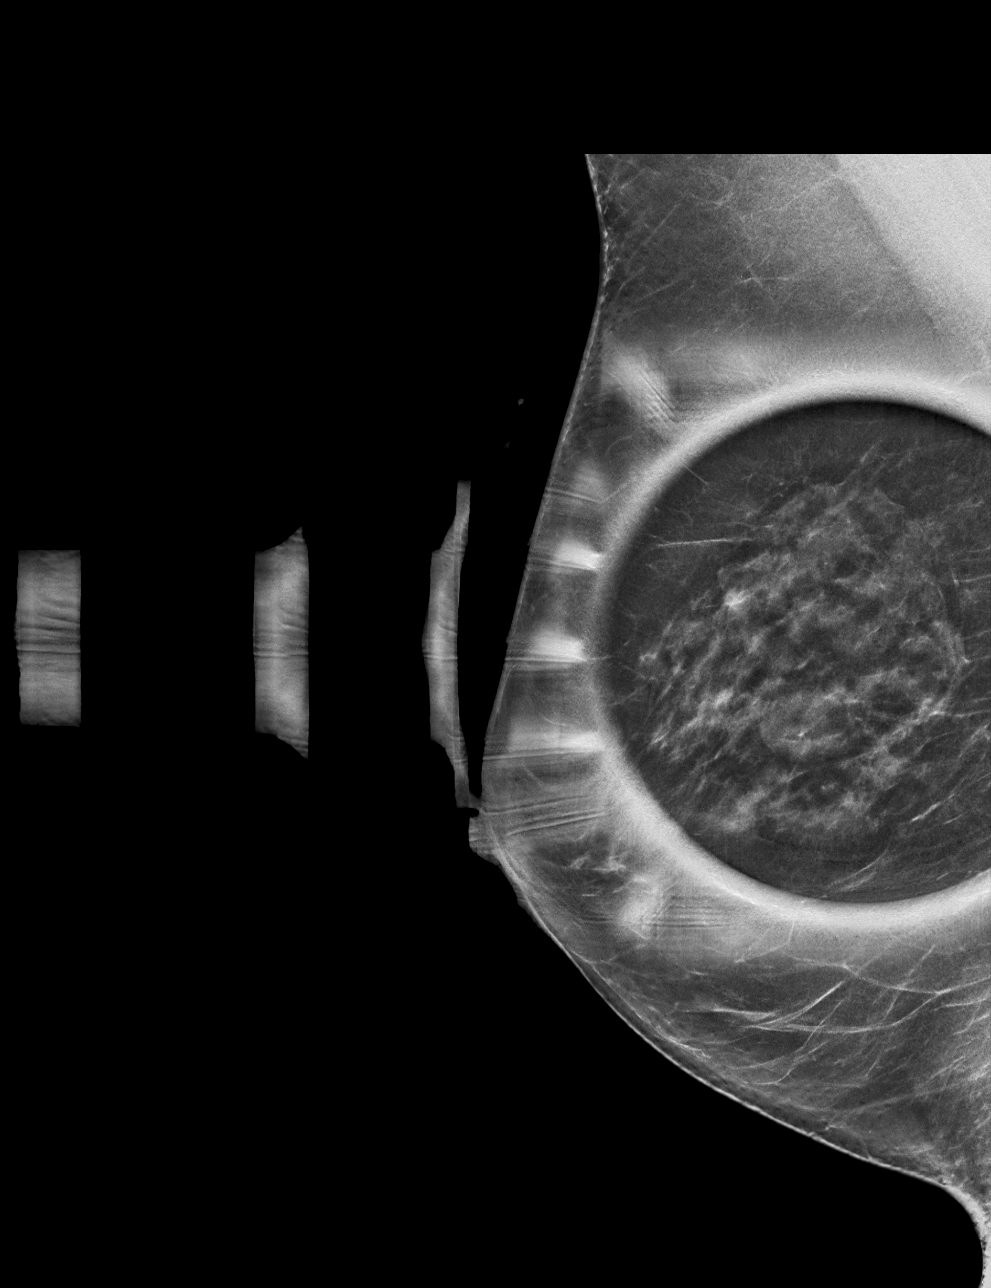

[R CC synth-2D]
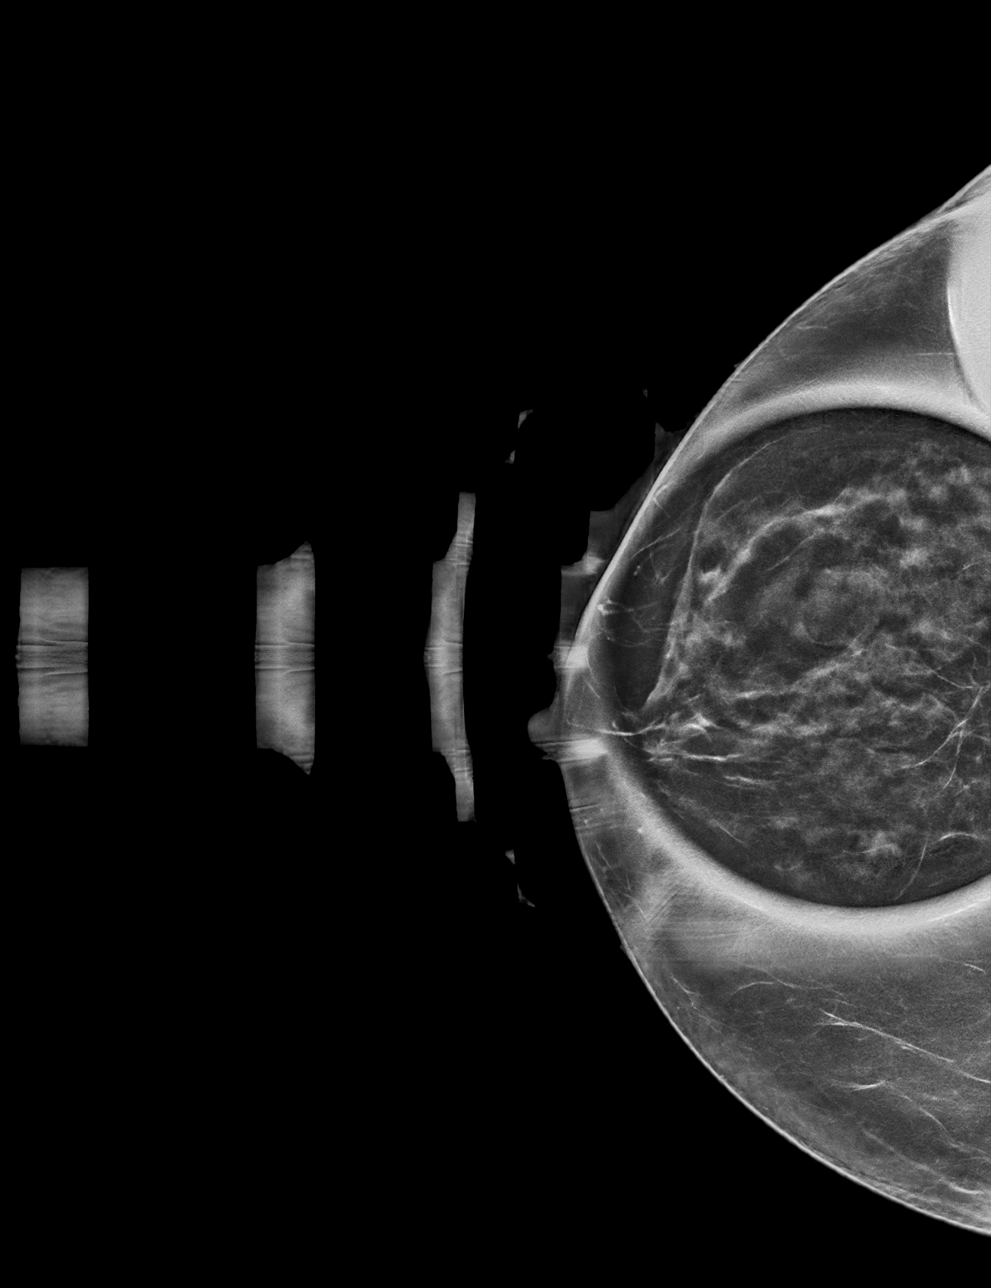

[R MLO tomo · tomo slice 29/57.0]
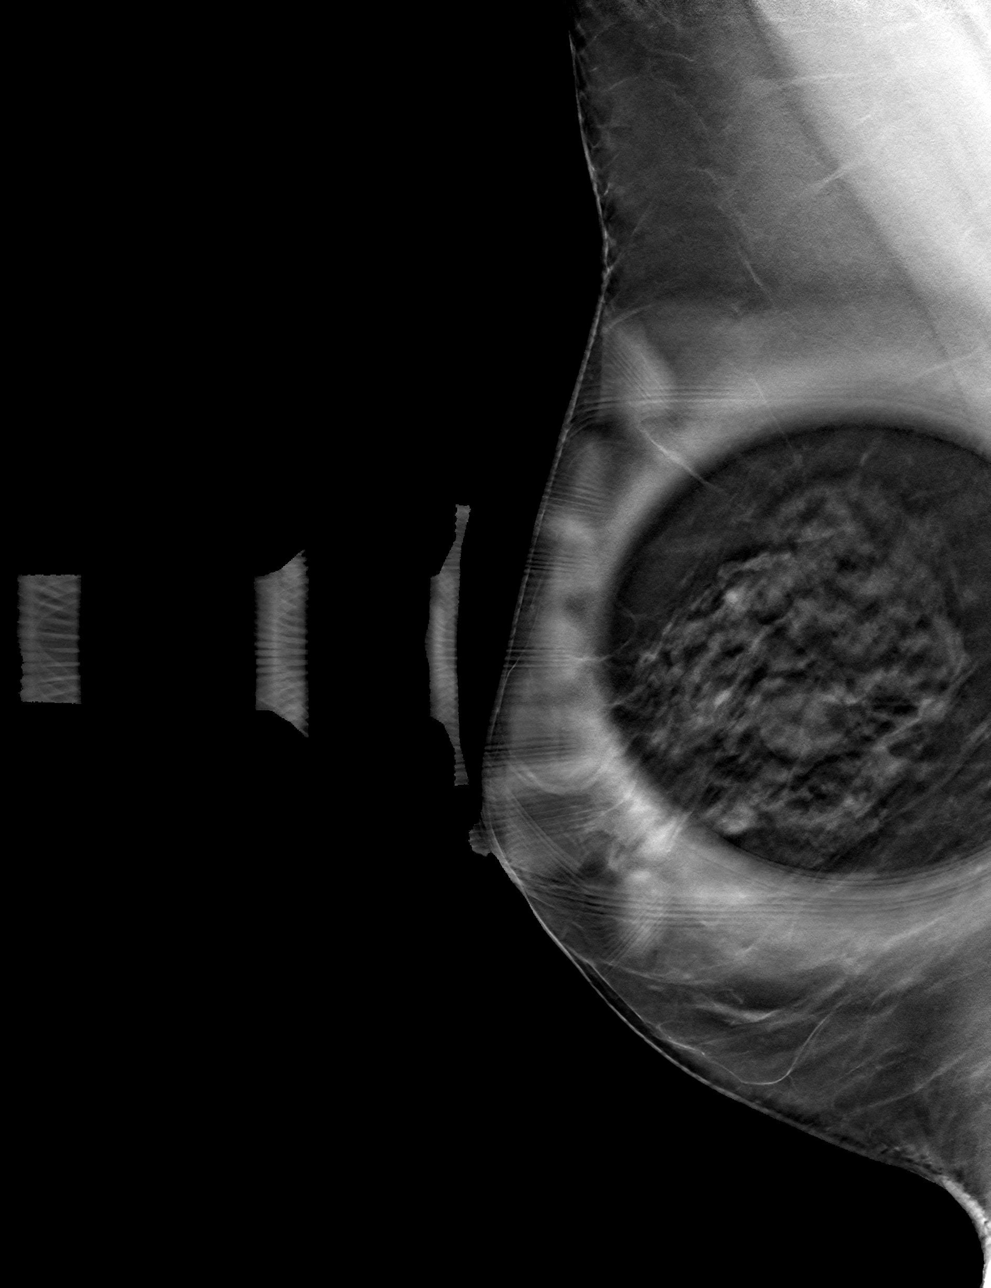

[R CC tomo · tomo slice 28/55.0]
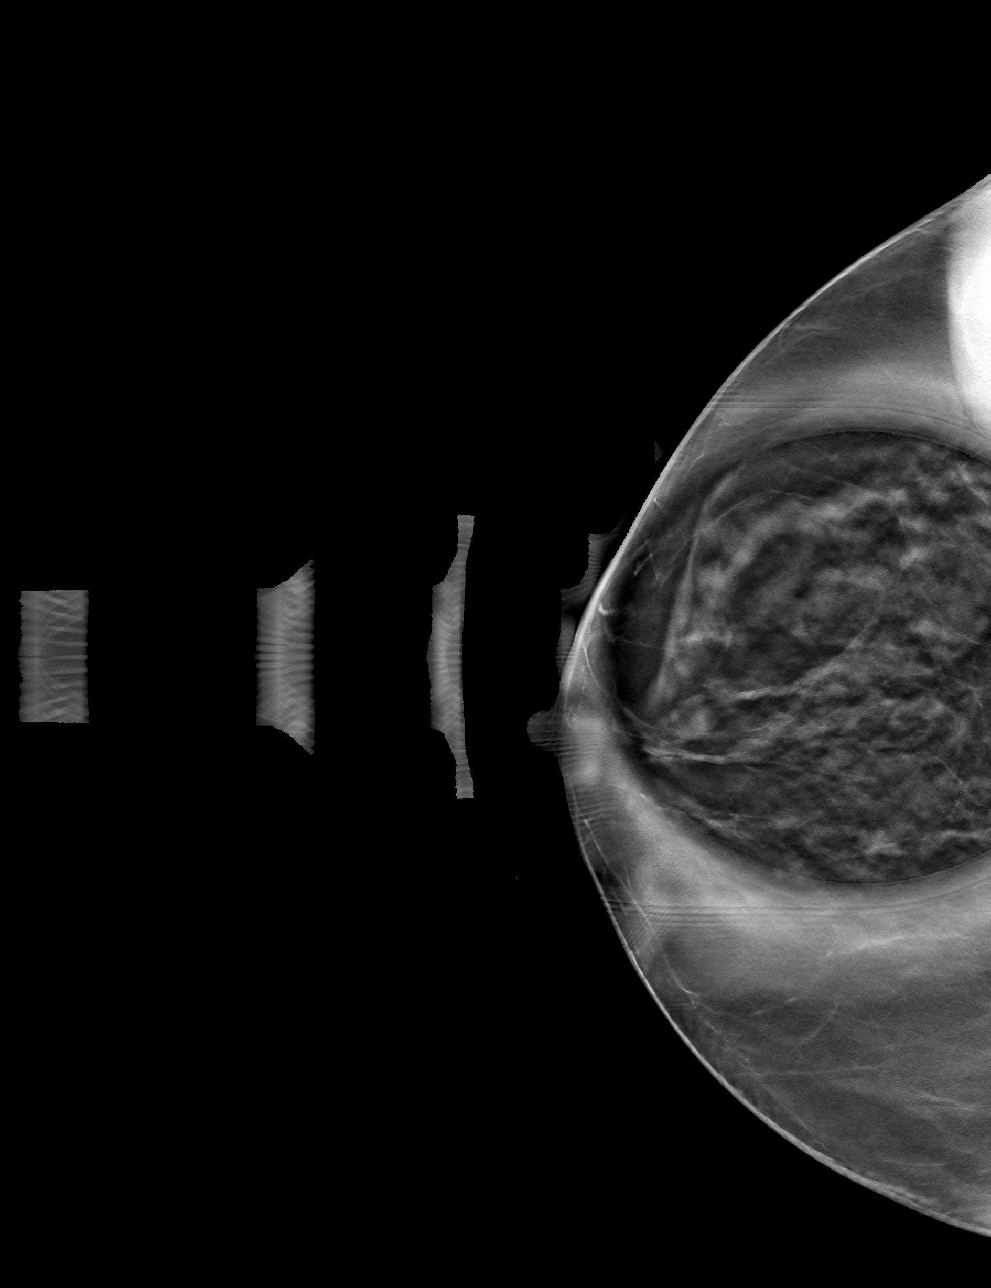

[4 of 12 positions shown; findings below may reference images not displayed]

ACR Breast Density Category c: The breast tissue is heterogeneously
dense, which may obscure small masses.
FINDINGS: The possible mass noted on the current screening study persists on
the diagnostic spot compression images as an oval, circumscribed,
1.5 cm mass projecting at approximately 8 o'clock.

Targeted right breast ultrasound is performed, showing a simple cyst
at 8:30 o'clock, 1 cm from the nipple, middle to posterior depth,
measuring 1.4 x 0.7 x 1.3 cm, consistent in size, shape and location
to the mammographic mass.
IMPRESSION: 1. No evidence of breast malignancy.
2. Benign right breast cyst.

RECOMMENDATION:
Screening mammogram in one year.(Code:[id number])

I have discussed the findings and recommendations with the patient.
If applicable, a reminder letter will be sent to the patient
regarding the next appointment.

BI-RADS CATEGORY  2: Benign.

## 2022-07-25 DIAGNOSIS — M25511 Pain in right shoulder: Secondary | ICD-10-CM | POA: Diagnosis not present

## 2022-07-25 DIAGNOSIS — G8929 Other chronic pain: Secondary | ICD-10-CM | POA: Diagnosis not present

## 2022-07-25 DIAGNOSIS — M75101 Unspecified rotator cuff tear or rupture of right shoulder, not specified as traumatic: Secondary | ICD-10-CM | POA: Diagnosis not present

## 2022-07-25 DIAGNOSIS — R6889 Other general symptoms and signs: Secondary | ICD-10-CM | POA: Diagnosis not present

## 2022-08-01 DIAGNOSIS — R6889 Other general symptoms and signs: Secondary | ICD-10-CM | POA: Diagnosis not present

## 2022-08-01 DIAGNOSIS — G8929 Other chronic pain: Secondary | ICD-10-CM | POA: Diagnosis not present

## 2022-08-01 DIAGNOSIS — M25511 Pain in right shoulder: Secondary | ICD-10-CM | POA: Diagnosis not present

## 2022-08-01 DIAGNOSIS — M75101 Unspecified rotator cuff tear or rupture of right shoulder, not specified as traumatic: Secondary | ICD-10-CM | POA: Diagnosis not present

## 2022-08-14 DIAGNOSIS — M25511 Pain in right shoulder: Secondary | ICD-10-CM | POA: Diagnosis not present

## 2022-08-14 DIAGNOSIS — G8929 Other chronic pain: Secondary | ICD-10-CM | POA: Diagnosis not present

## 2022-08-14 DIAGNOSIS — R6889 Other general symptoms and signs: Secondary | ICD-10-CM | POA: Diagnosis not present

## 2022-08-14 DIAGNOSIS — M75101 Unspecified rotator cuff tear or rupture of right shoulder, not specified as traumatic: Secondary | ICD-10-CM | POA: Diagnosis not present

## 2022-08-30 DIAGNOSIS — M75111 Incomplete rotator cuff tear or rupture of right shoulder, not specified as traumatic: Secondary | ICD-10-CM | POA: Diagnosis not present

## 2022-09-05 ENCOUNTER — Telehealth: Payer: Self-pay | Admitting: Cardiovascular Disease

## 2022-09-05 NOTE — Telephone Encounter (Signed)
   Name: Sheryl Edwards  DOB: 03-07-1951  MRN: [id number]  Primary Cardiologist: Mertie Moores, MD  Chart reviewed as part of pre-operative protocol coverage. Because of [name]'s past medical history and time since last visit, she will require a follow-up in-office visit in order to better assess preoperative cardiovascular risk.  Pre-op covering staff: - Please schedule appointment and call patient to inform them. If patient already had an upcoming appointment within acceptable timeframe, please add "pre-op clearance" to the appointment notes so provider is aware. - Please contact requesting surgeon's office via preferred method (i.e, phone, fax) to inform them of need for appointment prior to surgery.  Medications indicated to be held however patient is not on any medicines that require holding.  [name], [name], NP  09/05/2022, 11:49 AM

## 2022-09-05 NOTE — Telephone Encounter (Signed)
   Pre-operative Risk Assessment    Patient Name: Sheryl Edwards  DOB: 1951/04/27 MRN: [id number]      Request for Surgical Clearance    Procedure:   right shoulder arthroscopy   Date of Surgery:  Clearance TBD                                 Surgeon:  Dr. Tamera Punt  Surgeon's Group or Practice Name:  [facility] Phone number:  [phone] Fax number:  [phone]   Type of Clearance Requested:   - Medical  - Pharmacy:  Hold        Type of Anesthesia:  Not Indicated   Additional requests/questions:      SignedMilbert Coulter   09/05/2022, 11:44 AM

## 2022-09-05 NOTE — Telephone Encounter (Signed)
Pt has been scheduled to see Dr. Acie Fredrickson 11:40 for pre op clearance. I will update all parties involved. Once Dr. Acie Fredrickson has cleared the pt he will have his nurse send notes to surgeon office giving clearance and any medication recommendations.

## 2022-09-06 ENCOUNTER — Encounter: Payer: Self-pay | Admitting: Cardiovascular Disease

## 2022-09-06 ENCOUNTER — Ambulatory Visit: Payer: PPO | Attending: Cardiovascular Disease | Admitting: Cardiovascular Disease

## 2022-09-06 VITALS — BP 136/78 | HR 69 | Ht 65.0 in | Wt 161.3 lb

## 2022-09-06 DIAGNOSIS — R002 Palpitations: Secondary | ICD-10-CM

## 2022-09-06 DIAGNOSIS — I471 Supraventricular tachycardia, unspecified: Secondary | ICD-10-CM | POA: Diagnosis not present

## 2022-09-06 NOTE — Patient Instructions (Signed)
Medication Instructions:  Your physician recommends that you continue on your current medications as directed. Please refer to the Current Medication list given to you today.  *If you need a refill on your cardiac medications before your next appointment, please call your pharmacy*   Lab Work: NONE If you have labs (blood work) drawn today and your tests are completely normal, you will receive your results only by: [place] (if you have MyChart) OR A paper copy in the mail If you have any lab test that is abnormal or we need to change your treatment, we will call you to review the results.   Testing/Procedures: NONE   Follow-Up: At [facility], you and your health needs are our priority.  As part of our continuing mission to provide you with exceptional heart care, we have created designated Provider Care Teams.  These Care Teams include your primary Cardiologist (physician) and Advanced Practice Providers (APPs -  Physician Assistants and Nurse Practitioners) who all work together to provide you with the care you need, when you need it.  We recommend signing up for the patient portal called "MyChart".  Sign up information is provided on this After Visit Summary.  MyChart is used to connect with patients for Virtual Visits (Telemedicine).  Patients are able to view lab/test results, encounter notes, upcoming appointments, etc.  Non-urgent messages can be sent to your provider as well.   To learn more about what you can do with MyChart, go to [email or web address].    Your next appointment:   1 year(s)  Provider:   Mertie Moores, MD

## 2022-09-06 NOTE — Progress Notes (Signed)
Cardiology Office Note:    Date:  09/06/2022   ID:  Sheryl Edwards, DOB Jan 27, 1951, MRN [id number]  PCP:  Velna Hatchet, MD   [facility] Providers Cardiologist:  Kathrynn Humble to update primary MD,subspecialty MD or APP then REFRESH:1}    Referring MD: Velna Hatchet, MD   Chief Complaint  Patient presents with   Palpitations     Prior notes.:    Sheryl Edwards is a 72 y.o. female with a hx of palpitations.  Her mother was Melissa Noon.  We were asked to see her by Dr. Ardeth Perfect for further evaluation of her paliptations.  Has very rare episodes of palpitations. No significant episodes   No syncope   She had an event monitor in September, 2022.  She had occasional premature atrial contractions and had some episodes of nonsustained supraventricular tachycardia.  She had rare premature ventricular contractions.  No serious arrhythmias were observed. The longest episode of SVT lasted 16 seconds at a rate of 120.  TSH was normal a year ago .  No regular exercise  Is very sedentary.   Wants to get back into exercising  No cp, no dyspnea  Does yard work ,  no dyspnea, nor cp.  Gets hot .  No syncope or presyncope.  Jan. 12, 2023: Sheryl Edwards is seen today for follow up visit for her palpitations. Works at [employer]  Mother was Koleen Distance  Her event monitor showed occasional PACs and episodes of nonsustained SVT. Rare PVCs  No serious arrhythmias seen  We started her on coreg 3. 125 BID at her last visit  Advised her to cut out her processed meats, try to get better sleep  Seems to be sleeping better .  Getting some exercise   Feb. 14, 2024:  Sheryl Edwards is here for pre op visit.   She needs to have shoulder surgery . Has had episodes of nonsustained SVT  Has rare PVCs   Has retired from [employer] .  No cp or dyspnea with walking  We discussed her weight .  She likes carbs        Past Medical History:  Diagnosis Date   Allergic  rhinitis    Anxiety    BPPV (benign paroxysmal positional vertigo)    Chest pain    Hyperlipidemia    Situational stress    SVT (supraventricular tachycardia)     Past Surgical History:  Procedure Laterality Date   BUNIONECTOMY  1992   SEPTOPLASTY  1992   TONSILLECTOMY  1964    Current Medications: Current Meds  Medication Sig   carvedilol (COREG) 3.125 MG tablet Take 1 tablet (3.125 mg total) by mouth 2 (two) times daily with a meal.   Cholecalciferol (VITAMIN D3) 50 MCG (2000 UT) TABS Take by mouth.   co-enzyme Q-10 30 MG capsule Take 30 mg by mouth 3 (three) times daily.   magnesium oxide (MAG-OX) 400 MG tablet Take 400 mg by mouth daily.   Multiple Vitamins-Minerals (MULTI COMPLETE PO) Take by mouth.   Turmeric 500 MG TABS Take by mouth.   zinc gluconate 50 MG tablet Take 50 mg by mouth daily.   Current Facility-Administered Medications for the 09/06/22 encounter (Office Visit) with Thorsten Climer, Wonda Cheng, MD  Medication   triamcinolone acetonide (KENALOG) 10 MG/ML injection 10 mg   triamcinolone acetonide (KENALOG) 10 MG/ML injection 10 mg     Allergies:   Codeine   Social History   Socioeconomic History   Marital status: Married  Spouse name: Not on file   Number of children: Not on file   Years of education: Not on file   Highest education level: Not on file  Occupational History   Not on file  Tobacco Use   Smoking status: Never   Smokeless tobacco: Never  Substance and Sexual Activity   Alcohol use: Yes   Drug use: Not on file   Sexual activity: Not on file  Other Topics Concern   Not on file  Social History Narrative   Not on file   Social Determinants of Health   Financial Resource Strain: Not on file  Food Insecurity: Not on file  Transportation Needs: Not on file  Physical Activity: Not on file  Stress: Not on file  Social Connections: Not on file     Family History: The patient's family history includes CAD in her father; Diabetes in her  mother; Hypertension in her mother; Kidney disease in her father; Stroke in her mother.  ROS:   Please see the history of present illness.     All other systems reviewed and are negative.  EKGs/Labs/Other Studies Reviewed:    The following studies were reviewed today:   EKG:    Feb. 14, 2024:   NSR at 69.  Minimal voltage for LVH    Recent Labs: No results found for requested labs within last 365 days.  Recent Lipid Panel No results found for: "CHOL", "TRIG", "HDL", "CHOLHDL", "VLDL", "LDLCALC", "LDLDIRECT"   Risk Assessment/Calculations:           Physical Exam:     Physical Exam: Blood pressure 136/78, pulse 69, height 5' 5"$  (1.651 m), weight 161 lb 5.1 oz (73.2 kg), SpO2 99 %.       GEN:  Well nourished, well developed in no acute distress HEENT: Normal NECK: No JVD; No carotid bruits LYMPHATICS: No lymphadenopathy CARDIAC: RRR , no murmurs, rubs, gallops RESPIRATORY:  Clear to auscultation without rales, wheezing or rhonchi  ABDOMEN: Soft, non-tender, non-distended MUSCULOSKELETAL:  No edema; No deformity  SKIN: Warm and dry NEUROLOGIC:  Alert and oriented x 3   ASSESSMENT:    1. Palpitations   2. SVT (supraventricular tachycardia)     PLAN:     Episodes of premature atrial contractions and SVT.  Gordy Levan doing very well.  She is at low risk for her upcoming shoulder surgery.  She is not on any medications that need to be held.  Will plan on seeing her again in 1 year.     Medication Adjustments/Labs and Tests Ordered: Current medicines are reviewed at length with the patient today.  Concerns regarding medicines are outlined above.  Orders Placed This Encounter  Procedures   EKG 12-Lead   No orders of the defined types were placed in this encounter.   Patient Instructions  Medication Instructions:  Your physician recommends that you continue on your current medications as directed. Please refer to the Current Medication list given to you  today.  *If you need a refill on your cardiac medications before your next appointment, please call your pharmacy*   Lab Work: NONE If you have labs (blood work) drawn today and your tests are completely normal, you will receive your results only by: [place] (if you have MyChart) OR A paper copy in the mail If you have any lab test that is abnormal or we need to change your treatment, we will call you to review the results.   Testing/Procedures: NONE   Follow-Up: At  [facility], you and your health needs are our priority.  As part of our continuing mission to provide you with exceptional heart care, we have created designated Provider Care Teams.  These Care Teams include your primary Cardiologist (physician) and Advanced Practice Providers (APPs -  Physician Assistants and Nurse Practitioners) who all work together to provide you with the care you need, when you need it.  We recommend signing up for the patient portal called "MyChart".  Sign up information is provided on this After Visit Summary.  MyChart is used to connect with patients for Virtual Visits (Telemedicine).  Patients are able to view lab/test results, encounter notes, upcoming appointments, etc.  Non-urgent messages can be sent to your provider as well.   To learn more about what you can do with MyChart, go to [email or web address].    Your next appointment:   1 year(s)  Provider:   Mertie Moores, MD        Signed, Mertie Moores, MD  09/06/2022 12:16 PM    [place]

## 2022-09-08 NOTE — Telephone Encounter (Signed)
Dr. Elmarie Shiley office visit note faxed to requesting surgeon.   Justice Britain. Ajdin Macke, DNP, NP-C    09/08/2022, 7:26 AM [place] [address] Office [phone] Fax [phone]

## 2022-09-08 NOTE — Telephone Encounter (Signed)
Lelon Frohlich is at low risk for her upcoming shoulder surgery . She is not taking any meds from what I can see that would need to be held prior to surgery .     Mertie Moores, MD  09/08/2022 6:59 AM    [place] [address],  [place], [place]  [address] Phone: [phone]; Fax: [phone]

## 2022-09-14 ENCOUNTER — Emergency Department (HOSPITAL_BASED_OUTPATIENT_CLINIC_OR_DEPARTMENT_OTHER): Payer: PPO | Admitting: Radiology

## 2022-09-14 ENCOUNTER — Other Ambulatory Visit: Payer: Self-pay

## 2022-09-14 ENCOUNTER — Emergency Department (HOSPITAL_BASED_OUTPATIENT_CLINIC_OR_DEPARTMENT_OTHER)
Admission: EM | Admit: 2022-09-14 | Discharge: 2022-09-14 | Disposition: A | Discharge status: Home / Self Care | Payer: PPO | Attending: Emergency Medicine | Admitting: Emergency Medicine

## 2022-09-14 ENCOUNTER — Encounter (HOSPITAL_BASED_OUTPATIENT_CLINIC_OR_DEPARTMENT_OTHER): Payer: Self-pay

## 2022-09-14 DIAGNOSIS — M25511 Pain in right shoulder: Secondary | ICD-10-CM | POA: Diagnosis not present

## 2022-09-14 DIAGNOSIS — R059 Cough, unspecified: Secondary | ICD-10-CM | POA: Insufficient documentation

## 2022-09-14 DIAGNOSIS — R067 Sneezing: Secondary | ICD-10-CM | POA: Diagnosis not present

## 2022-09-14 DIAGNOSIS — R0789 Other chest pain: Secondary | ICD-10-CM | POA: Insufficient documentation

## 2022-09-14 DIAGNOSIS — R079 Chest pain, unspecified: Secondary | ICD-10-CM

## 2022-09-14 LAB — BASIC METABOLIC PANEL
Anion gap: 10 (ref 5–15)
BUN: 24 mg/dL — ABNORMAL HIGH (ref 8–23)
CO2: 27 mmol/L (ref 22–32)
Calcium: 9.8 mg/dL (ref 8.9–10.3)
Chloride: 99 mmol/L (ref 98–111)
Creatinine, Ser: 0.96 mg/dL (ref 0.44–1.00)
GFR, Estimated: >60 mL/min (ref >60)
Glucose, Bld: 117 mg/dL — ABNORMAL HIGH (ref 70–99)
Potassium: 4.2 mmol/L (ref 3.5–5.1)
Sodium: 136 mmol/L (ref 135–145)

## 2022-09-14 LAB — CBC WITH DIFFERENTIAL/PLATELET
Abs Immature Granulocytes: 0.04 10*3/uL (ref 0.00–0.07)
Basophils Absolute: 0.1 10*3/uL (ref 0.0–0.1)
Basophils Relative: 1 %
Eosinophils Absolute: 0.1 10*3/uL (ref 0.0–0.5)
Eosinophils Relative: 1 %
HCT: 43.8 % (ref 36.0–46.0)
Hemoglobin: 14.2 g/dL (ref 12.0–15.0)
Immature Granulocytes: 0 %
Lymphocytes Relative: 18 %
Lymphs Abs: 2.4 10*3/uL (ref 0.7–4.0)
MCH: 27.7 pg (ref 26.0–34.0)
MCHC: 32.4 g/dL (ref 30.0–36.0)
MCV: 85.5 fL (ref 80.0–100.0)
Monocytes Absolute: 1.3 10*3/uL — ABNORMAL HIGH (ref 0.1–1.0)
Monocytes Relative: 10 %
Neutro Abs: 9.3 10*3/uL — ABNORMAL HIGH (ref 1.7–7.7)
Neutrophils Relative %: 70 %
Platelets: 475 10*3/uL — ABNORMAL HIGH (ref 150–400)
RBC: 5.12 MIL/uL — ABNORMAL HIGH (ref 3.87–5.11)
RDW: 12.5 % (ref 11.5–15.5)
WBC: 13.1 10*3/uL — ABNORMAL HIGH (ref 4.0–10.5)
nRBC: 0 % (ref 0.0–0.2)

## 2022-09-14 LAB — TROPONIN I (HIGH SENSITIVITY)
Troponin I (High Sensitivity): 3 ng/L (ref <18)
Troponin I (High Sensitivity): 3 ng/L (ref <18)

## 2022-09-14 LAB — D-DIMER, QUANTITATIVE: D-Dimer, Quant: 0.55 ug/mL-FEU — ABNORMAL HIGH (ref 0.00–0.50)

## 2022-09-14 MED ORDER — ALUM & MAG HYDROXIDE-SIMETH 200-200-20 MG/5ML PO SUSP
30.0000 mL | Freq: Once | ORAL | Status: AC
  Administered 2022-09-14: 30 mL via ORAL
  Filled 2022-09-14: qty 30

## 2022-09-14 NOTE — ED Triage Notes (Signed)
POV from home, GCS 15, A&O x 4, amb to triage.   Sts pain under left breast began approx 8pm, then moved to left shoulder and back. Hx of SVT takes carvedilol.

## 2022-09-14 NOTE — Discharge Instructions (Signed)
Take 4 over the counter ibuprofen tablets 3 times a day or 2 over-the-counter naproxen tablets twice a day for pain. Also take tylenol 1000mg(2 extra strength) four times a day.    

## 2022-09-14 NOTE — ED Provider Notes (Signed)
[facility] Provider Note   CSN: [id number] Arrival date & time: 09/14/22  0030     History  Chief Complaint  Patient presents with   Chest Pain    Sheryl Edwards is a 71 y.o. female.  72 yo F with a chief complaints of chest pain.  This is left-sided and described as sharp.  Seems to be worse with deep breathing coughing sneezing.  Feels it radiates up into the left side of her neck.  She denies trauma denies exertional symptoms.  Patient denies history of MI, denies hypertension hyperlipidemia diabetes or smoking.  Denies family history of MI.  Patient denies history of PE or DVT denies hemoptysis denies unilateral lower extremity edema denies recent surgery immobilization hospitalization estrogen use or history of cancer.     Chest Pain      Home Medications Prior to Admission medications   Medication Sig Start Date End Date Taking? Authorizing Provider  carvedilol (COREG) 3.125 MG tablet Take 1 tablet (3.125 mg total) by mouth 2 (two) times daily with a meal. 06/20/21   Nahser, Wonda Cheng, MD  Cholecalciferol (VITAMIN D3) 50 MCG (2000 UT) TABS Take by mouth.    [provider]  co-enzyme Q-10 30 MG capsule Take 30 mg by mouth 3 (three) times daily.    [provider]  magnesium oxide (MAG-OX) 400 MG tablet Take 400 mg by mouth daily.    [provider]  Multiple Vitamins-Minerals (MULTI COMPLETE PO) Take by mouth.    [provider]  Turmeric 500 MG TABS Take by mouth.    [provider]  zinc gluconate 50 MG tablet Take 50 mg by mouth daily.    [provider]      Allergies    Codeine    Review of Systems   Review of Systems  Cardiovascular:  Positive for chest pain.    Physical Exam Updated Vital Signs BP 132/81   Pulse 80   Temp 98.2 F (36.8 C) (Oral)   Resp 18   Ht 5' 5"$  (1.651 m)   Wt 68 kg   SpO2 99%   BMI 24.96 kg/m  Physical Exam Vitals and  nursing note reviewed.  Constitutional:      General: She is not in acute distress.    Appearance: She is well-developed. She is not diaphoretic.  HENT:     Head: Normocephalic and atraumatic.  Eyes:     Pupils: Pupils are equal, round, and reactive to light.  Cardiovascular:     Rate and Rhythm: Normal rate and regular rhythm.     Heart sounds: No murmur heard.    No friction rub. No gallop.  Pulmonary:     Effort: Pulmonary effort is normal.     Breath sounds: No wheezing or rales.  Chest:     Comments: Patient grimaces but no obvious discomfort per her with palpation of the left side of her chest. Abdominal:     General: There is no distension.     Palpations: Abdomen is soft.     Tenderness: There is no abdominal tenderness.  Musculoskeletal:        General: No tenderness.     Cervical back: Normal range of motion and neck supple.  Skin:    General: Skin is warm and dry.  Neurological:     Mental Status: She is alert and oriented to person, place, and time.  Psychiatric:  Behavior: Behavior normal.     ED Results / Procedures / Treatments   Labs (all labs ordered are listed, but only abnormal results are displayed) Labs Reviewed  BASIC METABOLIC PANEL - Abnormal; Notable for the following components:      Result Value   Glucose, Bld 117 (*)    BUN 24 (*)    All other components within normal limits  CBC WITH DIFFERENTIAL/PLATELET - Abnormal; Notable for the following components:   WBC 13.1 (*)    RBC 5.12 (*)    Platelets 475 (*)    Neutro Abs 9.3 (*)    Monocytes Absolute 1.3 (*)    All other components within normal limits  D-DIMER, QUANTITATIVE - Abnormal; Notable for the following components:   D-Dimer, Quant 0.55 (*)    All other components within normal limits  TROPONIN I (HIGH SENSITIVITY)  TROPONIN I (HIGH SENSITIVITY)    EKG EKG Interpretation  Date/Time:  Thursday September 14 2022 00:36:24 EST Ventricular Rate:  94 PR  Interval:  162 QRS Duration: 74 QT Interval:  340 QTC Calculation: 425 R Axis:   46 Text Interpretation: Normal sinus rhythm with sinus arrhythmia Normal ECG No old tracing to compare Confirmed by Deno Etienne [phone]) on 09/14/2022 1:26:47 AM  Radiology DG Chest 2 View  Result Date: 09/14/2022 CLINICAL DATA:  Left-sided chest pain, initial encounter EXAM: CHEST - 2 VIEW COMPARISON:  None Available. FINDINGS: Cardiac shadow is within normal limits. Lungs are well aerated bilaterally. No focal infiltrate or effusion is seen. Mild S-shaped scoliosis of the thoracolumbar spine is noted. IMPRESSION: No acute abnormality noted. Electronically Signed   By: Inez Catalina M.D.   On: 09/14/2022 01:29    Procedures Procedures    Medications Ordered in ED Medications  alum & mag hydroxide-simeth (MAALOX/MYLANTA) 200-200-20 MG/5ML suspension 30 mL (30 mLs Oral Given 09/14/22 0224)    ED Course/ Medical Decision Making/ A&P                             Medical Decision Making Amount and/or Complexity of Data Reviewed Labs: ordered. Radiology: ordered.  Risk OTC drugs.   72 yo F with a chief complaint of left-sided chest pain.  This been going on for a few hours.  Started at rest.  Worse with deep breathing certain positions.  Sounds musculoskeletal but not able to reproduce it on exam.  Initial troponin is negative.  Will obtain a D-dimer.  Delta.  Mild leukocytosis.  No anemia.  No electrolyte abnormalities.  D-dimer is age-adjusted negative.  Second troponin also negative.  Will treat as musculoskeletal.  PCP follow-up.  3:13 AM:  I have discussed the diagnosis/risks/treatment options with the patient and family.  Evaluation and diagnostic testing in the emergency department does not suggest an emergent condition requiring admission or immediate intervention beyond what has been performed at this time.  They will follow up with PCP. We also discussed returning to the ED immediately if new or  worsening sx occur. We discussed the sx which are most concerning (e.g., sudden worsening pain, fever, inability to tolerate by mouth, exertional symptoms, hemoptysis) that necessitate immediate return. Medications administered to the patient during their visit and any new prescriptions provided to the patient are listed below.  Medications given during this visit Medications  alum & mag hydroxide-simeth (MAALOX/MYLANTA) 200-200-20 MG/5ML suspension 30 mL (30 mLs Oral Given 09/14/22 0224)     The patient appears reasonably  screen and/or stabilized for discharge and I doubt any other medical condition or other [facility] requiring further screening, evaluation, or treatment in the ED at this time prior to discharge.          Final Clinical Impression(s) / ED Diagnoses Final diagnoses:  Nonspecific chest pain    Rx / DC Orders ED Discharge Orders     None         Deno Etienne, DO 09/14/22 [phone]

## 2022-09-20 DIAGNOSIS — Z1231 Encounter for screening mammogram for malignant neoplasm of breast: Secondary | ICD-10-CM | POA: Diagnosis not present

## 2022-09-20 DIAGNOSIS — Z01419 Encounter for gynecological examination (general) (routine) without abnormal findings: Secondary | ICD-10-CM | POA: Diagnosis not present

## 2022-09-20 DIAGNOSIS — Z6827 Body mass index (BMI) 27.0-27.9, adult: Secondary | ICD-10-CM | POA: Diagnosis not present

## 2022-09-25 ENCOUNTER — Other Ambulatory Visit: Payer: Self-pay | Admitting: Obstetrics & Gynecology

## 2022-09-25 DIAGNOSIS — R928 Other abnormal and inconclusive findings on diagnostic imaging of breast: Secondary | ICD-10-CM

## 2022-10-02 DIAGNOSIS — M25511 Pain in right shoulder: Secondary | ICD-10-CM | POA: Diagnosis not present

## 2022-10-07 ENCOUNTER — Other Ambulatory Visit: Payer: PPO

## 2022-10-21 ENCOUNTER — Other Ambulatory Visit: Payer: PPO

## 2022-10-30 DIAGNOSIS — M25511 Pain in right shoulder: Secondary | ICD-10-CM | POA: Diagnosis not present

## 2022-11-01 ENCOUNTER — Ambulatory Visit: Payer: PPO

## 2022-11-01 ENCOUNTER — Ambulatory Visit
Admission: RE | Admit: 2022-11-01 | Discharge: 2022-11-01 | Disposition: A | Discharge status: Home / Self Care | Payer: PPO | Source: Ambulatory Visit | Attending: Obstetrics & Gynecology | Admitting: Obstetrics & Gynecology

## 2022-11-01 ENCOUNTER — Other Ambulatory Visit: Payer: Self-pay | Admitting: Obstetrics & Gynecology

## 2022-11-01 DIAGNOSIS — R928 Other abnormal and inconclusive findings on diagnostic imaging of breast: Secondary | ICD-10-CM

## 2022-11-01 DIAGNOSIS — R921 Mammographic calcification found on diagnostic imaging of breast: Secondary | ICD-10-CM | POA: Diagnosis not present

## 2022-11-01 DIAGNOSIS — R922 Inconclusive mammogram: Secondary | ICD-10-CM | POA: Diagnosis not present

## 2022-11-04 ENCOUNTER — Ambulatory Visit
Admission: RE | Admit: 2022-11-04 | Discharge: 2022-11-04 | Disposition: A | Discharge status: Home / Self Care | Payer: PPO | Source: Ambulatory Visit | Attending: Obstetrics & Gynecology | Admitting: Obstetrics & Gynecology

## 2022-11-04 DIAGNOSIS — R928 Other abnormal and inconclusive findings on diagnostic imaging of breast: Secondary | ICD-10-CM

## 2022-11-04 DIAGNOSIS — R921 Mammographic calcification found on diagnostic imaging of breast: Secondary | ICD-10-CM | POA: Diagnosis not present

## 2022-11-04 DIAGNOSIS — N6012 Diffuse cystic mastopathy of left breast: Secondary | ICD-10-CM | POA: Diagnosis not present

## 2022-11-04 HISTORY — PX: BREAST BIOPSY: SHX20

## 2022-11-06 DIAGNOSIS — J029 Acute pharyngitis, unspecified: Secondary | ICD-10-CM | POA: Diagnosis not present

## 2022-11-06 DIAGNOSIS — J069 Acute upper respiratory infection, unspecified: Secondary | ICD-10-CM | POA: Diagnosis not present

## 2022-11-06 DIAGNOSIS — R0981 Nasal congestion: Secondary | ICD-10-CM | POA: Diagnosis not present

## 2022-11-06 DIAGNOSIS — R059 Cough, unspecified: Secondary | ICD-10-CM | POA: Diagnosis not present

## 2022-11-14 DIAGNOSIS — H66001 Acute suppurative otitis media without spontaneous rupture of ear drum, right ear: Secondary | ICD-10-CM | POA: Diagnosis not present

## 2022-11-14 DIAGNOSIS — H9201 Otalgia, right ear: Secondary | ICD-10-CM | POA: Diagnosis not present

## 2022-11-14 DIAGNOSIS — R059 Cough, unspecified: Secondary | ICD-10-CM | POA: Diagnosis not present

## 2022-11-14 DIAGNOSIS — J069 Acute upper respiratory infection, unspecified: Secondary | ICD-10-CM | POA: Diagnosis not present

## 2022-11-29 DIAGNOSIS — H6501 Acute serous otitis media, right ear: Secondary | ICD-10-CM | POA: Diagnosis not present

## 2023-02-20 DIAGNOSIS — M25511 Pain in right shoulder: Secondary | ICD-10-CM | POA: Diagnosis not present

## 2023-03-29 DIAGNOSIS — G4700 Insomnia, unspecified: Secondary | ICD-10-CM | POA: Diagnosis not present

## 2023-03-29 DIAGNOSIS — E663 Overweight: Secondary | ICD-10-CM | POA: Diagnosis not present

## 2023-03-29 DIAGNOSIS — G8929 Other chronic pain: Secondary | ICD-10-CM | POA: Diagnosis not present

## 2023-03-29 DIAGNOSIS — M1990 Unspecified osteoarthritis, unspecified site: Secondary | ICD-10-CM | POA: Diagnosis not present

## 2023-03-29 DIAGNOSIS — I499 Cardiac arrhythmia, unspecified: Secondary | ICD-10-CM | POA: Diagnosis not present

## 2023-03-29 DIAGNOSIS — E785 Hyperlipidemia, unspecified: Secondary | ICD-10-CM | POA: Diagnosis not present

## 2023-05-01 DIAGNOSIS — Z1389 Encounter for screening for other disorder: Secondary | ICD-10-CM | POA: Diagnosis not present

## 2023-05-01 DIAGNOSIS — E785 Hyperlipidemia, unspecified: Secondary | ICD-10-CM | POA: Diagnosis not present

## 2023-05-15 DIAGNOSIS — E785 Hyperlipidemia, unspecified: Secondary | ICD-10-CM | POA: Diagnosis not present

## 2023-05-15 DIAGNOSIS — Z0000 Encounter for general adult medical examination without abnormal findings: Secondary | ICD-10-CM | POA: Diagnosis not present

## 2023-05-15 DIAGNOSIS — I471 Supraventricular tachycardia, unspecified: Secondary | ICD-10-CM | POA: Diagnosis not present

## 2023-05-15 DIAGNOSIS — R002 Palpitations: Secondary | ICD-10-CM | POA: Diagnosis not present

## 2023-05-15 DIAGNOSIS — Z1331 Encounter for screening for depression: Secondary | ICD-10-CM | POA: Diagnosis not present

## 2023-05-15 DIAGNOSIS — Z23 Encounter for immunization: Secondary | ICD-10-CM | POA: Diagnosis not present

## 2023-05-15 DIAGNOSIS — Z1339 Encounter for screening examination for other mental health and behavioral disorders: Secondary | ICD-10-CM | POA: Diagnosis not present

## 2023-05-15 DIAGNOSIS — F418 Other specified anxiety disorders: Secondary | ICD-10-CM | POA: Diagnosis not present

## 2023-05-16 ENCOUNTER — Other Ambulatory Visit: Payer: Self-pay | Admitting: Internal Medicine

## 2023-05-16 DIAGNOSIS — E785 Hyperlipidemia, unspecified: Secondary | ICD-10-CM

## 2023-05-17 ENCOUNTER — Encounter (INDEPENDENT_AMBULATORY_CARE_PROVIDER_SITE_OTHER): Payer: Self-pay

## 2023-05-17 ENCOUNTER — Institutional Professional Consult (permissible substitution) (INDEPENDENT_AMBULATORY_CARE_PROVIDER_SITE_OTHER): Payer: PPO

## 2023-05-17 ENCOUNTER — Encounter (INDEPENDENT_AMBULATORY_CARE_PROVIDER_SITE_OTHER): Payer: Self-pay | Admitting: Otolaryngology

## 2023-05-17 ENCOUNTER — Ambulatory Visit (INDEPENDENT_AMBULATORY_CARE_PROVIDER_SITE_OTHER): Payer: PPO | Admitting: Otolaryngology

## 2023-05-17 VITALS — Ht 65.0 in | Wt 150.0 lb

## 2023-05-17 DIAGNOSIS — H938X3 Other specified disorders of ear, bilateral: Secondary | ICD-10-CM | POA: Diagnosis not present

## 2023-05-17 DIAGNOSIS — H6993 Unspecified Eustachian tube disorder, bilateral: Secondary | ICD-10-CM | POA: Diagnosis not present

## 2023-05-17 NOTE — Progress Notes (Signed)
Dear Dr. Link Snuffer, Here is my assessment for our mutual patient, Sheryl Edwards. Thank you for allowing me the opportunity to care for your patient. Please do not hesitate to contact me should you have any other questions. Sincerely, Dr. Jovita Kussmaul  Otolaryngology Clinic Note Referring provider: Dr. Link Snuffer HPI:  Sheryl Edwards is a 72 y.o. female kindly referred by Dr. Link Snuffer for evaluation of ear fullness. She reports that anytime she has a sinus issue - she starts to have ear fullness. This time it was on the right. She had URI in April, and then had COVID in September. She did not get abx or steroids for COVID but had abx and steroids in April. She was having facial pain, cough, headache. Resolved. She saw [facility] for this in May and was Dx with serous OM and placed on fluticasone and Pred. She had similar symptoms after COVID - popping/crackling and muffled hearing ("water in the ear that wouldn't come out). She did not try anything but it has resolved for the past month.  She denies any otologic symptoms, and reports no history of barotrauma, ear infections, vestibular suppressant use. Hearing is back to normal  She tried flonase in the past and it did not help much.  H&N Surgery: Tonsillectomy, Sinus surgery and Septoplasty (1993) Personal or FHx of bleeding dz or anesthesia difficulty: no   PMHx: HLD  Independent Review of Additional Tests or Records:  PCP and prior ENT notes reviewed   PMH/Meds/All/SocHx/FamHx/ROS:   Past Medical History:  Diagnosis Date   Allergic rhinitis    Anxiety    BPPV (benign paroxysmal positional vertigo)    Chest pain    Hyperlipidemia    Situational stress    SVT (supraventricular tachycardia) (HCC)      Past Surgical History:  Procedure Laterality Date   BREAST BIOPSY Left 11/04/2022   MM LT BREAST BX W LOC DEV 1ST LESION IMAGE BX SPEC STEREO GUIDE 11/04/2022 [facility]   BUNIONECTOMY  1992   SEPTOPLASTY  1992    TONSILLECTOMY  1964    Family History  Problem Relation Age of Onset   Stroke Mother    Hypertension Mother    Diabetes Mother    CAD Father    Kidney disease Father      Social Connections: Not on file     Current Outpatient Medications:    carvedilol (COREG) 3.125 MG tablet, Take 1 tablet (3.125 mg total) by mouth 2 (two) times daily with a meal., Disp: 60 tablet, Rfl: 6   Cholecalciferol (VITAMIN D3) 50 MCG (2000 UT) TABS, Take by mouth., Disp: , Rfl:    magnesium oxide (MAG-OX) 400 MG tablet, Take 400 mg by mouth daily., Disp: , Rfl:    Turmeric 500 MG TABS, Take by mouth., Disp: , Rfl:    zinc gluconate 50 MG tablet, Take 50 mg by mouth daily., Disp: , Rfl:    co-enzyme Q-10 30 MG capsule, Take 30 mg by mouth 3 (three) times daily. (Patient not taking: Reported on 05/17/2023), Disp: , Rfl:    Multiple Vitamins-Minerals (MULTI COMPLETE PO), Take by mouth. (Patient not taking: Reported on 05/17/2023), Disp: , Rfl:   Current Facility-Administered Medications:    triamcinolone acetonide (KENALOG) 10 MG/ML injection 10 mg, 10 mg, Other, Once, Sikora, Richard, DPM   triamcinolone acetonide (KENALOG) 10 MG/ML injection 10 mg, 10 mg, Other, Once, Alvan Dame, DPM   Physical Exam:   Ht 5\' 5"  (1.651 m)   Wt 150 lb (  68 kg)   BMI 24.96 kg/m    Salient findings:  CN II-XII intact  Bilateral EAC clear and TM intact with well pneumatized middle ear spaces; small cartilaginous EACs Weber 512: midline Rinne 512: AC > BC b/l  Rine 1024: AC > BC b/l  Anterior rhinoscopy: Septum relatively midline; no masses noted on anterior rhinoscopy No lesions of oral cavity/oropharynx; dentition intact No obviously palpable neck masses/lymphadenopathy/thyromegaly No respiratory distress or stridor  Procedures:  Procedure: Bilateral ear microscopy using microscope (CPT 92504) Pre-procedure diagnosis: ear fullness, bilateral eustachian tube dysfunction Post-procedure diagnosis:  same Indication: see above; given patient's otologic complaints and history, for improved and comprehensive examination of external ear and tympanic membrane, bilateral otologic examination using microscope was performed  Procedure: Patient was placed semi-recumbent. Both ear canals were examined using the microscope with findings above.  Left: EAC was patent. TM was intact . Middle ear was aerated. Drainage: no Right: EAC was patent. TM was intact . Middle ear was aerated . Drainage: no Patient tolerated the procedure well.       Impression & Plans:  Sheryl Edwards is a 72 y.o. female with: Bilateral ear fullness after URI - now resolved Bilateral eustachian tube dysfunction - Asympatomatic now and no effusion noted today. We discussed management of ETD, which she only has after URI and we discussed timeline for effusion resolution. Would recommend trying flonase 2 puffs BID during episodes and afrin. Also try auto insufflation - she does not wish for audiogram as she is asymptomatic  - f/u PRN   Thank you for allowing me the opportunity to care for your patient. Please do not hesitate to contact me should you have any other questions.  Sincerely, Jovita Kussmaul, MD Otolarynoglogist (ENT), [facility] ENT Specialist Phone: [phone] Fax: [phone]  05/17/2023, 1:47 PM

## 2023-05-23 ENCOUNTER — Telehealth: Payer: Self-pay | Admitting: Cardiovascular Disease

## 2023-05-23 NOTE — Telephone Encounter (Signed)
Returned call to patient to let her know that he did state "1 year follow-up" at her last visit, but it was for pre-op and she wasn't having any issues at that time so if she didn't feel she needed to be seen, that was perfectly fine. She states that she is having a coronary CT tomorrow and will likely need to see him next year as a result of that. She will call back if so.

## 2023-05-23 NOTE — Telephone Encounter (Signed)
Patient called and said that she was under the impression that she was released by Dr. Elease Hashimoto after first visit. Please call and confirm

## 2023-05-24 ENCOUNTER — Ambulatory Visit
Admission: RE | Admit: 2023-05-24 | Discharge: 2023-05-24 | Disposition: A | Discharge status: Home / Self Care | Payer: PPO | Source: Ambulatory Visit | Attending: Internal Medicine | Admitting: Internal Medicine

## 2023-05-24 DIAGNOSIS — E785 Hyperlipidemia, unspecified: Secondary | ICD-10-CM

## 2023-05-30 DIAGNOSIS — H25013 Cortical age-related cataract, bilateral: Secondary | ICD-10-CM | POA: Diagnosis not present

## 2023-05-30 DIAGNOSIS — H5203 Hypermetropia, bilateral: Secondary | ICD-10-CM | POA: Diagnosis not present

## 2023-05-30 DIAGNOSIS — H2513 Age-related nuclear cataract, bilateral: Secondary | ICD-10-CM | POA: Diagnosis not present

## 2023-06-11 DIAGNOSIS — M7541 Impingement syndrome of right shoulder: Secondary | ICD-10-CM | POA: Diagnosis not present

## 2023-06-28 DIAGNOSIS — D2261 Melanocytic nevi of right upper limb, including shoulder: Secondary | ICD-10-CM | POA: Diagnosis not present

## 2023-06-28 DIAGNOSIS — L814 Other melanin hyperpigmentation: Secondary | ICD-10-CM | POA: Diagnosis not present

## 2023-06-28 DIAGNOSIS — L821 Other seborrheic keratosis: Secondary | ICD-10-CM | POA: Diagnosis not present

## 2023-06-28 DIAGNOSIS — L910 Hypertrophic scar: Secondary | ICD-10-CM | POA: Diagnosis not present

## 2023-06-28 DIAGNOSIS — D485 Neoplasm of uncertain behavior of skin: Secondary | ICD-10-CM | POA: Diagnosis not present

## 2023-06-28 DIAGNOSIS — D2262 Melanocytic nevi of left upper limb, including shoulder: Secondary | ICD-10-CM | POA: Diagnosis not present

## 2023-06-28 DIAGNOSIS — L309 Dermatitis, unspecified: Secondary | ICD-10-CM | POA: Diagnosis not present

## 2023-06-28 DIAGNOSIS — D225 Melanocytic nevi of trunk: Secondary | ICD-10-CM | POA: Diagnosis not present

## 2023-08-07 DIAGNOSIS — M24611 Ankylosis, right shoulder: Secondary | ICD-10-CM | POA: Diagnosis not present

## 2023-08-07 DIAGNOSIS — M948X1 Other specified disorders of cartilage, shoulder: Secondary | ICD-10-CM | POA: Diagnosis not present

## 2023-08-07 DIAGNOSIS — M75111 Incomplete rotator cuff tear or rupture of right shoulder, not specified as traumatic: Secondary | ICD-10-CM | POA: Diagnosis not present

## 2023-08-07 DIAGNOSIS — X58XXXA Exposure to other specified factors, initial encounter: Secondary | ICD-10-CM | POA: Diagnosis not present

## 2023-08-07 DIAGNOSIS — M19011 Primary osteoarthritis, right shoulder: Secondary | ICD-10-CM | POA: Diagnosis not present

## 2023-08-07 DIAGNOSIS — M94211 Chondromalacia, right shoulder: Secondary | ICD-10-CM | POA: Diagnosis not present

## 2023-08-07 DIAGNOSIS — S43431A Superior glenoid labrum lesion of right shoulder, initial encounter: Secondary | ICD-10-CM | POA: Diagnosis not present

## 2023-08-07 DIAGNOSIS — M7541 Impingement syndrome of right shoulder: Secondary | ICD-10-CM | POA: Diagnosis not present

## 2023-08-07 DIAGNOSIS — M24011 Loose body in right shoulder: Secondary | ICD-10-CM | POA: Diagnosis not present

## 2023-08-07 DIAGNOSIS — M65911 Unspecified synovitis and tenosynovitis, right shoulder: Secondary | ICD-10-CM | POA: Diagnosis not present

## 2023-08-07 DIAGNOSIS — M24111 Other articular cartilage disorders, right shoulder: Secondary | ICD-10-CM | POA: Diagnosis not present

## 2023-08-07 DIAGNOSIS — M7551 Bursitis of right shoulder: Secondary | ICD-10-CM | POA: Diagnosis not present

## 2023-08-07 DIAGNOSIS — Y999 Unspecified external cause status: Secondary | ICD-10-CM | POA: Diagnosis not present

## 2023-08-07 DIAGNOSIS — M67811 Other specified disorders of synovium, right shoulder: Secondary | ICD-10-CM | POA: Diagnosis not present

## 2023-08-07 DIAGNOSIS — G8918 Other acute postprocedural pain: Secondary | ICD-10-CM | POA: Diagnosis not present

## 2023-08-17 DIAGNOSIS — M25511 Pain in right shoulder: Secondary | ICD-10-CM | POA: Diagnosis not present

## 2023-08-21 DIAGNOSIS — M25511 Pain in right shoulder: Secondary | ICD-10-CM | POA: Diagnosis not present

## 2023-08-28 DIAGNOSIS — M25511 Pain in right shoulder: Secondary | ICD-10-CM | POA: Diagnosis not present

## 2023-08-31 DIAGNOSIS — M25511 Pain in right shoulder: Secondary | ICD-10-CM | POA: Diagnosis not present

## 2023-09-04 DIAGNOSIS — M25511 Pain in right shoulder: Secondary | ICD-10-CM | POA: Diagnosis not present

## 2023-09-07 DIAGNOSIS — M25511 Pain in right shoulder: Secondary | ICD-10-CM | POA: Diagnosis not present

## 2023-09-11 DIAGNOSIS — M25511 Pain in right shoulder: Secondary | ICD-10-CM | POA: Diagnosis not present

## 2023-09-14 DIAGNOSIS — M25511 Pain in right shoulder: Secondary | ICD-10-CM | POA: Diagnosis not present

## 2023-10-04 DIAGNOSIS — Z6827 Body mass index (BMI) 27.0-27.9, adult: Secondary | ICD-10-CM | POA: Diagnosis not present

## 2023-10-04 DIAGNOSIS — Z1231 Encounter for screening mammogram for malignant neoplasm of breast: Secondary | ICD-10-CM | POA: Diagnosis not present

## 2023-10-04 DIAGNOSIS — Z124 Encounter for screening for malignant neoplasm of cervix: Secondary | ICD-10-CM | POA: Diagnosis not present

## 2023-10-29 DIAGNOSIS — E785 Hyperlipidemia, unspecified: Secondary | ICD-10-CM | POA: Diagnosis not present

## 2023-10-29 DIAGNOSIS — I2510 Atherosclerotic heart disease of native coronary artery without angina pectoris: Secondary | ICD-10-CM | POA: Diagnosis not present

## 2023-10-29 DIAGNOSIS — F418 Other specified anxiety disorders: Secondary | ICD-10-CM | POA: Diagnosis not present

## 2023-10-29 DIAGNOSIS — E663 Overweight: Secondary | ICD-10-CM | POA: Diagnosis not present

## 2023-10-29 DIAGNOSIS — I471 Supraventricular tachycardia, unspecified: Secondary | ICD-10-CM | POA: Diagnosis not present

## 2023-11-01 ENCOUNTER — Encounter: Payer: Self-pay | Admitting: Cardiovascular Disease

## 2023-11-01 NOTE — Progress Notes (Unsigned)
 Cardiology Office Note:    Date:  11/02/2023   ID:  Sheryl Edwards, DOB 05-04-1951, MRN [id number]  PCP:  Alysia Penna, MD   [facility] Providers Cardiologist:  Eugenia Pancoast to update primary MD,subspecialty MD or APP then REFRESH:1}    Referring MD: Alysia Penna, MD   Chief Complaint  Patient presents with   Palpitations     Prior notes.:    Sheryl Edwards is a 73 y.o. female with a hx of palpitations.  Her mother was Sarita Haver.  We were asked to see her by Dr. Link Snuffer for further evaluation of her paliptations.  Has very rare episodes of palpitations. No significant episodes   No syncope   She had an event monitor in September, 2022.  She had occasional premature atrial contractions and had some episodes of nonsustained supraventricular tachycardia.  She had rare premature ventricular contractions.  No serious arrhythmias were observed. The longest episode of SVT lasted 16 seconds at a rate of 120.  TSH was normal a year ago .  No regular exercise  Is very sedentary.   Wants to get back into exercising  No cp, no dyspnea  Does yard work ,  no dyspnea, nor cp.  Gets hot .  No syncope or presyncope.  Jan. 12, 2023: Sheryl Edwards is seen today for follow up visit for her palpitations. Works at [employer]  Mother was Doree Fudge  Her event monitor showed occasional PACs and episodes of nonsustained SVT. Rare PVCs  No serious arrhythmias seen  We started her on coreg 3. 125 BID at her last visit  Advised her to cut out her processed meats, try to get better sleep  Seems to be sleeping better .  Getting some exercise   Feb. 14, 2024:  Sheryl Edwards is here for pre op visit.   She needs to have shoulder surgery . Has had episodes of nonsustained SVT  Has rare PVCs   Has retired from [employer] .  No cp or dyspnea with walking  We discussed her weight .  She likes carbs    November 02, 2023 Sheryl Edwards is seen for follow up of her  palpitations Has episodes of nonsustained SVT , has rare PVCs Is the retired Scientist, physiological at [employer]   Apo B was found to be elevated at 135 LDL is 180 Chol = 266  She does not want to take a statin She just started rosuvastatin  Is not feeling very well on it   We discussed the PCSK9 inhibitor or Inclisiran   Will send her to the lipid clinic for discussion    CAC score is 11  - 48th percentile for age / sex matched controls            Past Medical History:  Diagnosis Date   Allergic rhinitis    Anxiety    BPPV (benign paroxysmal positional vertigo)    Chest pain    Hyperlipidemia    Situational stress    SVT (supraventricular tachycardia) (HCC)     Past Surgical History:  Procedure Laterality Date   BREAST BIOPSY Left 11/04/2022   MM LT BREAST BX W LOC DEV 1ST LESION IMAGE BX SPEC STEREO GUIDE 11/04/2022 [facility]   BUNIONECTOMY  1992   SEPTOPLASTY  1992   TONSILLECTOMY  1964    Current Medications: Current Meds  Medication Sig   carvedilol (COREG) 3.125 MG tablet Take 1 tablet (3.125 mg total) by mouth 2 (two) times daily with a  meal.   Cholecalciferol (VITAMIN D3) 50 MCG (2000 UT) TABS Take by mouth.   co-enzyme Q-10 30 MG capsule Take 30 mg by mouth 3 (three) times daily.   CREATINE PO Take 5 mg by mouth daily at 6 (six) AM.   MAGNESIUM GLYCINATE PO Take 1 tablet by mouth daily at 6 (six) AM.   magnesium oxide (MAG-OX) 400 MG tablet Take 400 mg by mouth daily.   Turmeric 500 MG TABS Take by mouth.   UNABLE TO FIND Take 5 mg by mouth daily. Med Name: Creatine 5 mg   UNABLE TO FIND Take 1 capsule by mouth daily. Med Name: Niacinimide   Vitamin D-Vitamin K (VITAMIN K2-VITAMIN D3 PO) Take 1 tablet by mouth daily.   zinc gluconate 50 MG tablet Take 50 mg by mouth daily.   Current Facility-Administered Medications for the 11/02/23 encounter (Office Visit) with Corayma Cashatt, Deloris Ping, MD  Medication   triamcinolone acetonide (KENALOG) 10  MG/ML injection 10 mg   triamcinolone acetonide (KENALOG) 10 MG/ML injection 10 mg     Allergies:   Codeine   Social History   Socioeconomic History   Marital status: Married    Spouse name: Not on file   Number of children: Not on file   Years of education: Not on file   Highest education level: Not on file  Occupational History   Not on file  Tobacco Use   Smoking status: Never   Smokeless tobacco: Never  Substance and Sexual Activity   Alcohol use: Yes   Drug use: Not on file   Sexual activity: Not on file  Other Topics Concern   Not on file  Social History Narrative   Not on file   Social Drivers of Health   Financial Resource Strain: Not on file  Food Insecurity: Not on file  Transportation Needs: Not on file  Physical Activity: Not on file  Stress: Not on file  Social Connections: Not on file     Family History: The patient's family history includes CAD in her father; Diabetes in her mother; Hypertension in her mother; Kidney disease in her father; Stroke in her mother.  ROS:   Please see the history of present illness.     All other systems reviewed and are negative.  EKGs/Labs/Other Studies Reviewed:    The following studies were reviewed today:   EKG:     EKG Interpretation Date/Time:  Friday November 02 2023 11:32:25 EDT Ventricular Rate:  79 PR Interval:  168 QRS Duration:  80 QT Interval:  384 QTC Calculation: 440 R Axis:   52  Text Interpretation: Sinus rhythm with Premature supraventricular complexes When compared with ECG of 14-Sep-2022 00:36, Premature supraventricular complexes are now Present Confirmed by Kristeen Miss ([id number]) on 11/02/2023 11:38:27 AM     Recent Labs: No results found for requested labs within last 365 days.  Recent Lipid Panel No results found for: "CHOL", "TRIG", "HDL", "CHOLHDL", "VLDL", "LDLCALC", "LDLDIRECT"   Risk Assessment/Calculations:           Physical Exam:      Physical Exam: Blood pressure  138/78, pulse 64, height 5\' 5"  (1.651 m), weight 164 lb (74.4 kg), SpO2 97%.       GEN:  Well nourished, well developed in no acute distress HEENT: Normal NECK: No JVD; No carotid bruits LYMPHATICS: No lymphadenopathy CARDIAC: RRR , no murmurs, rubs, gallops RESPIRATORY:  Clear to auscultation without rales, wheezing or rhonchi  ABDOMEN: Soft, non-tender, non-distended MUSCULOSKELETAL:  No edema; No deformity  SKIN: Warm and dry NEUROLOGIC:  Alert and oriented x 3   ASSESSMENT:    1. Palpitations   2. Coronary artery calcification of native artery   3. Elevated LDL cholesterol level      PLAN:     Episodes of premature atrial contractions and SVT.  2.  Coronary artery calcifications: Her last LDL is 180.  She is very resistant to taking statins although she is reluctantly try 1 now.  She thinks is making her feel poorly.  Will refer her to the lipid clinic for consideration of a PCSK9 inhibitor or inclisiran.  Will check a lipoprotein a on her today.        Medication Adjustments/Labs and Tests Ordered: Current medicines are reviewed at length with the patient today.  Concerns regarding medicines are outlined above.  Orders Placed This Encounter  Procedures   Lipoprotein A (LPA)   AMB Referral to [facility] Pharm-D   EKG 12-Lead   No orders of the defined types were placed in this encounter.   Patient Instructions  Lab Work: Lipoprotein (a) today If you have labs (blood work) drawn today and your tests are completely normal, you will receive your results only by: MyChart Message (if you have MyChart) OR A paper copy in the mail If you have any lab test that is abnormal or we need to change your treatment, we will call you to review the results.  Testing/Procedures: Ambulatory referral to lipid clinic  Follow-Up: At [facility], you and your health needs are our priority.  As part of our continuing mission to provide you with exceptional heart  care, our providers are all part of one team.  This team includes your primary Cardiologist (physician) and Advanced Practice Providers or APPs (Physician Assistants and Nurse Practitioners) who all work together to provide you with the care you need, when you need it.  Your next appointment:   1 year(s)  Provider:   Kristeen Miss, MD        1st Floor: - Lobby - Registration  - Pharmacy  - Lab - Cafe  2nd Floor: - PV Lab - Diagnostic Testing (echo, CT, nuclear med)  3rd Floor: - Vacant  4th Floor: - TCTS (cardiothoracic surgery) - AFib Clinic - Structural Heart Clinic - Vascular Surgery  - Vascular Ultrasound  5th Floor: - HeartCare Cardiology (general and EP) - Clinical Pharmacy for coumadin, hypertension, lipid, weight-loss medications, and med management appointments    Valet parking services will be available as well.     Signed, Kristeen Miss, MD  11/02/2023 11:56 AM    [facility]

## 2023-11-02 ENCOUNTER — Ambulatory Visit: Attending: Cardiology | Admitting: Cardiovascular Disease

## 2023-11-02 ENCOUNTER — Encounter: Payer: Self-pay | Admitting: Cardiovascular Disease

## 2023-11-02 VITALS — BP 138/78 | HR 64 | Ht 65.0 in | Wt 164.0 lb

## 2023-11-02 DIAGNOSIS — E7800 Pure hypercholesterolemia, unspecified: Secondary | ICD-10-CM

## 2023-11-02 DIAGNOSIS — R002 Palpitations: Secondary | ICD-10-CM

## 2023-11-02 DIAGNOSIS — I2584 Coronary atherosclerosis due to calcified coronary lesion: Secondary | ICD-10-CM

## 2023-11-02 DIAGNOSIS — I2510 Atherosclerotic heart disease of native coronary artery without angina pectoris: Secondary | ICD-10-CM

## 2023-11-02 NOTE — Patient Instructions (Signed)
 Lab Work: Lipoprotein (a) today If you have labs (blood work) drawn today and your tests are completely normal, you will receive your results only by: MyChart Message (if you have MyChart) OR A paper copy in the mail If you have any lab test that is abnormal or we need to change your treatment, we will call you to review the results.  Testing/Procedures: Ambulatory referral to lipid clinic  Follow-Up: At [facility], you and your health needs are our priority.  As part of our continuing mission to provide you with exceptional heart care, our providers are all part of one team.  This team includes your primary Cardiologist (physician) and Advanced Practice Providers or APPs (Physician Assistants and Nurse Practitioners) who all work together to provide you with the care you need, when you need it.  Your next appointment:   1 year(s)  Provider:   Kristeen Miss, MD        1st Floor: - Lobby - Registration  - Pharmacy  - Lab - Cafe  2nd Floor: - PV Lab - Diagnostic Testing (echo, CT, nuclear med)  3rd Floor: - Vacant  4th Floor: - TCTS (cardiothoracic surgery) - AFib Clinic - Structural Heart Clinic - Vascular Surgery  - Vascular Ultrasound  5th Floor: - HeartCare Cardiology (general and EP) - Clinical Pharmacy for coumadin, hypertension, lipid, weight-loss medications, and med management appointments    Valet parking services will be available as well.

## 2023-11-05 LAB — LIPOPROTEIN A (LPA): Lipoprotein (a): <8.4 nmol/L (ref <75.0)

## 2023-11-06 ENCOUNTER — Encounter: Payer: Self-pay | Admitting: Cardiovascular Disease

## 2023-11-07 ENCOUNTER — Encounter: Payer: Self-pay | Admitting: Internal Medicine

## 2023-11-07 DIAGNOSIS — E785 Hyperlipidemia, unspecified: Secondary | ICD-10-CM | POA: Diagnosis not present

## 2023-11-07 DIAGNOSIS — I2510 Atherosclerotic heart disease of native coronary artery without angina pectoris: Secondary | ICD-10-CM | POA: Diagnosis not present

## 2023-11-20 ENCOUNTER — Encounter: Payer: Self-pay | Admitting: Cardiovascular Disease

## 2023-12-03 DIAGNOSIS — R233 Spontaneous ecchymoses: Secondary | ICD-10-CM | POA: Diagnosis not present

## 2024-01-03 ENCOUNTER — Ambulatory Visit: Attending: Cardiovascular Disease | Admitting: Pharmacist

## 2024-01-03 DIAGNOSIS — E785 Hyperlipidemia, unspecified: Secondary | ICD-10-CM | POA: Diagnosis not present

## 2024-01-03 DIAGNOSIS — E7800 Pure hypercholesterolemia, unspecified: Secondary | ICD-10-CM

## 2024-01-03 NOTE — Assessment & Plan Note (Signed)
 Assessment: LDL-C is above goal of less than 70 Patient currently taking red yeast rice without any issue She was prescribed rosuvastatin but took 3 doses She was prescribed both the 20 mg and the 5 mg of rosuvastatin.  Believes she still has the 5 mg at home LP(a) less than 8.4 No tobacco or alcohol  use Consumes a decent amount of calories and sugar through her drinks.  We discussed decreasing this.  Encouraged her to increase fiber intake  Plan: Patient is agreeable to trying rosuvastatin 5 mg every other day Stop Red yeast rice Repeat labs in 3 months

## 2024-01-03 NOTE — Patient Instructions (Addendum)
 Please stop red yeast rice Start rosuvastatin 5mg  every other day Please call me at [phone] with any issues Please come for lab work in 2-3 months. You can come to the 1st floor of [address] fasting

## 2024-01-03 NOTE — Progress Notes (Signed)
 Patient ID: Sheryl Edwards                 DOB: 12-07-1950                    MRN: [id number]      HPI: Sheryl Edwards is a 73 y.o. female patient referred to lipid clinic by Dr. Alroy Aspen. PMH is significant for SVT, PVC, HLD, CAC 11 (48th percentile).   In Oct, Apo B was found to be 135, LDL-C 180. Started on rosuvastatin but did not tolerate very well.  11/07/23 LDL-C 81, HDL 69, TC 169 (red yeast rice and 3 days of rosuvastatin)  This individual has an estimated 10-year risk of CVD = 11.7% This individual has an estimated 10-year risk of ASCVD = 8.5% Intermediate risk (7.5% to 19.9%)  Patient presents today to lipid clinic.  She reports that she took about 3 doses of rosuvastatin 10 mg.  She has been taking red yeast rice.  Her labs in April are reflective of red yeast rice and 3 days of rosuvastatin just prior.  We discussed that red yeast rice is metabolized in the body to lovastatin.  We discussed that supplements like red yeast rice are not regulated and it is hard to tell how much red yeast rice are actually getting.  We discussed that rosuvastatin is generally well-tolerated statin.  Reviewed her coronary calcium score and her 10-year prevent score.  Current Medications: Red yeast rice Intolerances:  Risk Factors: Coronary calcium score 11 (48th percentile), elevated baseline LDL-C and ApoB LDL-C goal: <70 ApoB goal: <80  Diet: breakfast: coffee w/ cream and sugar, 2 egg omlette, cottage cheese 4% milk fat Lunch: sometimes skips Dinner: beans, broccoli Snack: PB crackers, peppers, cucumbers w/ itilian dressing Drink: cherry pepsi, milk (whole), pineapple juice, grapefruit juice  Exercise: silver sneakers- chair exercises at least 3 days, walks dog 1-2 times  Family History:  Family History  Problem Relation Age of Onset   Stroke Mother    Hypertension Mother    Diabetes Mother    CAD Father    Kidney disease Father     Social History: no tobacco, no  ETOH  Labs: Lipid Panel  LP(a) <8.4 ApoB 135 LDL-C 180 No results found for: CHOL, TRIG, HDL, CHOLHDL, VLDL, LDLCALC, LDLDIRECT, LABVLDL  Past Medical History:  Diagnosis Date   Allergic rhinitis    Anxiety    BPPV (benign paroxysmal positional vertigo)    Chest pain    Hyperlipidemia    Situational stress    SVT (supraventricular tachycardia) (HCC)     Current Outpatient Medications on File Prior to Visit  Medication Sig Dispense Refill   carvedilol  (COREG ) 3.125 MG tablet Take 1 tablet (3.125 mg total) by mouth 2 (two) times daily with a meal. 60 tablet 6   CREATINE PO Take 5 mg by mouth daily at 6 (six) AM.     MAGNESIUM GLYCINATE PO Take 1 tablet by mouth daily at 6 (six) AM.     rosuvastatin (CRESTOR) 5 MG tablet Take 5 mg by mouth every other day.     Turmeric 500 MG TABS Take by mouth.     UNABLE TO FIND Take 1 capsule by mouth daily. Med Name: Niacinimide     Vitamin D-Vitamin K (VITAMIN K2-VITAMIN D3 PO) Take 1 tablet by mouth daily.     zinc gluconate 50 MG tablet Take 50 mg by mouth daily.     co-enzyme Q-10 30 MG capsule  Take 30 mg by mouth 3 (three) times daily. (Patient not taking: Reported on 01/03/2024)     UNABLE TO FIND Take 5 mg by mouth daily. Med Name: Creatine 5 mg     Current Facility-Administered Medications on File Prior to Visit  Medication Dose Route Frequency Provider Last Rate Last Admin   triamcinolone  acetonide (KENALOG ) 10 MG/ML injection 10 mg  10 mg Other Once Sikora, Richard, DPM       triamcinolone  acetonide (KENALOG ) 10 MG/ML injection 10 mg  10 mg Other Once Sikora, Richard, DPM        Allergies  Allergen Reactions   Codeine Nausea Only    Assessment/Plan:  1. Hyperlipidemia -  HLD (hyperlipidemia) Assessment: LDL-C is above goal of less than 70 Patient currently taking red yeast rice without any issue She was prescribed rosuvastatin but took 3 doses She was prescribed both the 20 mg and the 5 mg of rosuvastatin.   Believes she still has the 5 mg at home LP(a) less than 8.4 No tobacco or alcohol  use Consumes a decent amount of calories and sugar through her drinks.  We discussed decreasing this.  Encouraged her to increase fiber intake  Plan: Patient is agreeable to trying rosuvastatin 5 mg every other day Stop Red yeast rice Repeat labs in 3 months    Thank you,  Davelle Anselmi D Rufino Staup, Pharm.Monika Annas, CPP [facility] A Division of [facility] [address] [place], [place] [address]  Phone: [phone]; Fax: [phone]

## 2024-01-28 DIAGNOSIS — E7800 Pure hypercholesterolemia, unspecified: Secondary | ICD-10-CM | POA: Diagnosis not present

## 2024-01-29 ENCOUNTER — Ambulatory Visit: Payer: Self-pay | Admitting: Pharmacist

## 2024-01-29 DIAGNOSIS — I2510 Atherosclerotic heart disease of native coronary artery without angina pectoris: Secondary | ICD-10-CM | POA: Diagnosis not present

## 2024-01-29 DIAGNOSIS — I471 Supraventricular tachycardia, unspecified: Secondary | ICD-10-CM | POA: Diagnosis not present

## 2024-01-29 DIAGNOSIS — E785 Hyperlipidemia, unspecified: Secondary | ICD-10-CM | POA: Diagnosis not present

## 2024-01-29 DIAGNOSIS — E663 Overweight: Secondary | ICD-10-CM | POA: Diagnosis not present

## 2024-01-29 DIAGNOSIS — F418 Other specified anxiety disorders: Secondary | ICD-10-CM | POA: Diagnosis not present

## 2024-01-29 LAB — APOLIPOPROTEIN B: Apolipoprotein B: 110 mg/dL — AB (ref <90)

## 2024-01-29 LAB — LIPID PANEL
Chol/HDL Ratio: 3.4 ratio (ref 0.0–4.4)
Cholesterol, Total: 228 mg/dL — ABNORMAL HIGH (ref 100–199)
HDL: 67 mg/dL (ref >39)
LDL Chol Calc (NIH): 141 mg/dL — ABNORMAL HIGH (ref 0–99)
Triglycerides: 112 mg/dL (ref 0–149)
VLDL Cholesterol Cal: 20 mg/dL (ref 5–40)

## 2024-02-06 DIAGNOSIS — M8588 Other specified disorders of bone density and structure, other site: Secondary | ICD-10-CM | POA: Diagnosis not present

## 2024-02-06 DIAGNOSIS — N958 Other specified menopausal and perimenopausal disorders: Secondary | ICD-10-CM | POA: Diagnosis not present

## 2024-02-11 NOTE — Telephone Encounter (Signed)
 Called pt and LVM to discuss results

## 2024-02-11 NOTE — Telephone Encounter (Signed)
 Pt returning your call

## 2024-03-21 ENCOUNTER — Telehealth: Payer: Self-pay | Admitting: Cardiovascular Disease

## 2024-03-21 NOTE — Telephone Encounter (Signed)
  Pt c/o medication issue:  1. Name of Medication: rosuvastatin (CRESTOR) 5 MG tablet   2. How are you currently taking this medication (dosage and times per day)?   Route: Take 5 mg by mouth every other day.   3. Are you having a reaction (difficulty breathing--STAT)?  na  4. What is your medication issue?  Patient states that she is having major leg cramps and has stopped taking the medication. Needs to discuss other options

## 2024-03-31 ENCOUNTER — Ambulatory Visit (INDEPENDENT_AMBULATORY_CARE_PROVIDER_SITE_OTHER): Admitting: Podiatry

## 2024-03-31 ENCOUNTER — Encounter: Payer: Self-pay | Admitting: Podiatry

## 2024-03-31 DIAGNOSIS — M7989 Other specified soft tissue disorders: Secondary | ICD-10-CM

## 2024-03-31 NOTE — Progress Notes (Signed)
  Subjective:  Patient ID: Sheryl Edwards, female    DOB: October 30, 1950,   MRN: [id number]  Chief Complaint  Patient presents with   Callouses    I have this place on my big toe.  It's been there since December 5 when I had surgery on it.    73 y.o. female presents for concern of right great toe. Relates she had surgery on it December 5th.  Dermatology removed the spot and came back as scar tissue. Relates off and on it has swollen and become inflammed still. Currently not very painful but has been prior. . Denies any other pedal complaints. Denies n/v/f/c.   Past Medical History:  Diagnosis Date   Allergic rhinitis    Anxiety    BPPV (benign paroxysmal positional vertigo)    Chest pain    Hyperlipidemia    Situational stress    SVT (supraventricular tachycardia) (HCC)     Objective:  Physical Exam: Vascular: DP/PT pulses 2/4 bilateral. CFT <3 seconds. Normal hair growth on digits. No edema.  Skin. No lacerations or abrasions bilateral feet. Right hallux dorsal first metatarsophalangeal joint. Tiny amount of fluctuance noted underlying lesion. Bluish red discoloration noted in the lesion with hyperkeratosis.  Musculoskeletal: MMT 5/5 bilateral lower extremities in DF, PF, Inversion and Eversion. Deceased ROM in DF of ankle joint.  Neurological: Sensation intact to light touch.   Assessment:   1. Mass of soft tissue      Plan:  Patient was evaluated and treated and all questions answered. Discussed ganglion cysts vs other lesion and treatment options with the patient. Not any fluctance to aspirate today.  Discussed biopsy of lesion again but patient would like to observe at this time and will call back if painful again enough for removal.  Patient to follow-up as needed   Asberry Failing, DPM

## 2024-05-07 ENCOUNTER — Ambulatory Visit: Admitting: Pharmacist

## 2024-05-10 DIAGNOSIS — H5712 Ocular pain, left eye: Secondary | ICD-10-CM | POA: Diagnosis not present

## 2024-05-10 DIAGNOSIS — H10502 Unspecified blepharoconjunctivitis, left eye: Secondary | ICD-10-CM | POA: Diagnosis not present

## 2024-05-12 ENCOUNTER — Telehealth: Payer: Self-pay | Admitting: Podiatry

## 2024-05-12 ENCOUNTER — Other Ambulatory Visit: Payer: Self-pay | Admitting: Podiatry

## 2024-05-12 DIAGNOSIS — M7989 Other specified soft tissue disorders: Secondary | ICD-10-CM

## 2024-05-12 DIAGNOSIS — H1032 Unspecified acute conjunctivitis, left eye: Secondary | ICD-10-CM | POA: Diagnosis not present

## 2024-05-12 NOTE — Telephone Encounter (Signed)
 Ann left a message requesting a referral for an MRI/ Would she need to be seen first?

## 2024-05-13 DIAGNOSIS — Z79899 Other long term (current) drug therapy: Secondary | ICD-10-CM | POA: Diagnosis not present

## 2024-05-13 DIAGNOSIS — Z0189 Encounter for other specified special examinations: Secondary | ICD-10-CM | POA: Diagnosis not present

## 2024-05-13 DIAGNOSIS — E785 Hyperlipidemia, unspecified: Secondary | ICD-10-CM | POA: Diagnosis not present

## 2024-05-13 LAB — LAB REPORT - SCANNED: EGFR: 70.7

## 2024-05-20 DIAGNOSIS — G720 Drug-induced myopathy: Secondary | ICD-10-CM | POA: Diagnosis not present

## 2024-05-20 DIAGNOSIS — M8580 Other specified disorders of bone density and structure, unspecified site: Secondary | ICD-10-CM | POA: Diagnosis not present

## 2024-05-20 DIAGNOSIS — Z1339 Encounter for screening examination for other mental health and behavioral disorders: Secondary | ICD-10-CM | POA: Diagnosis not present

## 2024-05-20 DIAGNOSIS — Z0000 Encounter for general adult medical examination without abnormal findings: Secondary | ICD-10-CM | POA: Diagnosis not present

## 2024-05-20 DIAGNOSIS — M79671 Pain in right foot: Secondary | ICD-10-CM | POA: Diagnosis not present

## 2024-05-20 DIAGNOSIS — E785 Hyperlipidemia, unspecified: Secondary | ICD-10-CM | POA: Diagnosis not present

## 2024-05-20 DIAGNOSIS — I471 Supraventricular tachycardia, unspecified: Secondary | ICD-10-CM | POA: Diagnosis not present

## 2024-05-20 DIAGNOSIS — I2510 Atherosclerotic heart disease of native coronary artery without angina pectoris: Secondary | ICD-10-CM | POA: Diagnosis not present

## 2024-05-20 DIAGNOSIS — Z1331 Encounter for screening for depression: Secondary | ICD-10-CM | POA: Diagnosis not present

## 2024-05-20 DIAGNOSIS — M1990 Unspecified osteoarthritis, unspecified site: Secondary | ICD-10-CM | POA: Diagnosis not present

## 2024-05-20 DIAGNOSIS — R002 Palpitations: Secondary | ICD-10-CM | POA: Diagnosis not present

## 2024-05-20 DIAGNOSIS — F418 Other specified anxiety disorders: Secondary | ICD-10-CM | POA: Diagnosis not present

## 2024-05-26 ENCOUNTER — Ambulatory Visit
Admission: RE | Admit: 2024-05-26 | Discharge: 2024-05-26 | Disposition: A | Discharge status: Home / Self Care | Source: Ambulatory Visit | Attending: Podiatry | Admitting: Podiatry

## 2024-05-26 DIAGNOSIS — M19071 Primary osteoarthritis, right ankle and foot: Secondary | ICD-10-CM | POA: Diagnosis not present

## 2024-05-26 DIAGNOSIS — M7989 Other specified soft tissue disorders: Secondary | ICD-10-CM

## 2024-05-30 ENCOUNTER — Telehealth: Payer: Self-pay | Admitting: Podiatry

## 2024-05-30 NOTE — Telephone Encounter (Signed)
 Patient states she saw that her MRI results are in. She requests a call to discuss. Does she need to be scheduled for an appointment?

## 2024-06-03 NOTE — Telephone Encounter (Signed)
 Patient declined making an appointment to discuss results.

## 2024-07-18 ENCOUNTER — Encounter: Payer: Self-pay | Admitting: Pharmacist
